# Patient Record
Sex: Female | Born: 1950 | Race: White | Hispanic: No | Marital: Married | Smoking: Never smoker
Health system: Southern US, Community
[De-identification: ages and names within clinical notes are randomized; demographics above are authoritative.]

## PROBLEM LIST (undated history)

## (undated) DIAGNOSIS — I340 Nonrheumatic mitral (valve) insufficiency: Secondary | ICD-10-CM

## (undated) DIAGNOSIS — S83206A Unspecified tear of unspecified meniscus, current injury, right knee, initial encounter: Secondary | ICD-10-CM

## (undated) DIAGNOSIS — F410 Panic disorder [episodic paroxysmal anxiety] without agoraphobia: Secondary | ICD-10-CM

## (undated) DIAGNOSIS — I071 Rheumatic tricuspid insufficiency: Secondary | ICD-10-CM

## (undated) DIAGNOSIS — J84112 Idiopathic pulmonary fibrosis: Secondary | ICD-10-CM

## (undated) DIAGNOSIS — Z8709 Personal history of other diseases of the respiratory system: Secondary | ICD-10-CM

## (undated) DIAGNOSIS — M2550 Pain in unspecified joint: Secondary | ICD-10-CM

## (undated) DIAGNOSIS — G4734 Idiopathic sleep related nonobstructive alveolar hypoventilation: Secondary | ICD-10-CM

## (undated) DIAGNOSIS — I5189 Other ill-defined heart diseases: Secondary | ICD-10-CM

## (undated) DIAGNOSIS — T753XXA Motion sickness, initial encounter: Secondary | ICD-10-CM

## (undated) DIAGNOSIS — I700 Atherosclerosis of aorta: Secondary | ICD-10-CM

## (undated) DIAGNOSIS — Z9981 Dependence on supplemental oxygen: Secondary | ICD-10-CM

## (undated) DIAGNOSIS — I351 Nonrheumatic aortic (valve) insufficiency: Secondary | ICD-10-CM

## (undated) DIAGNOSIS — J8410 Pulmonary fibrosis, unspecified: Secondary | ICD-10-CM

## (undated) DIAGNOSIS — R0600 Dyspnea, unspecified: Secondary | ICD-10-CM

## (undated) DIAGNOSIS — R053 Chronic cough: Secondary | ICD-10-CM

## (undated) DIAGNOSIS — K219 Gastro-esophageal reflux disease without esophagitis: Secondary | ICD-10-CM

## (undated) DIAGNOSIS — E213 Hyperparathyroidism, unspecified: Secondary | ICD-10-CM

## (undated) HISTORY — DX: Unspecified tear of unspecified meniscus, current injury, right knee, initial encounter: S83.206A

## (undated) HISTORY — DX: Gastro-esophageal reflux disease without esophagitis: K21.9

## (undated) HISTORY — DX: Panic disorder (episodic paroxysmal anxiety): F41.0

---

## 1976-07-16 HISTORY — PX: TUBAL LIGATION: SHX77

## 2004-07-16 HISTORY — PX: COLONOSCOPY: SHX174

## 2005-02-28 ENCOUNTER — Ambulatory Visit: Payer: Self-pay | Admitting: Unknown Physician Specialty

## 2005-04-02 ENCOUNTER — Ambulatory Visit: Payer: Self-pay | Admitting: Unknown Physician Specialty

## 2005-04-06 HISTORY — PX: ESOPHAGOGASTRODUODENOSCOPY: SHX1529

## 2006-02-26 ENCOUNTER — Ambulatory Visit: Payer: Self-pay | Admitting: Family Medicine

## 2007-01-10 ENCOUNTER — Emergency Department: Payer: Self-pay | Admitting: Emergency Medicine

## 2007-01-10 ENCOUNTER — Other Ambulatory Visit: Payer: Self-pay

## 2008-07-16 LAB — HM MAMMOGRAPHY

## 2008-07-27 ENCOUNTER — Ambulatory Visit: Payer: Self-pay | Admitting: Family Medicine

## 2009-06-23 ENCOUNTER — Ambulatory Visit: Payer: Self-pay

## 2009-07-16 HISTORY — PX: KNEE ARTHROSCOPY W/ MENISCAL REPAIR: SHX1877

## 2009-07-29 ENCOUNTER — Ambulatory Visit: Payer: Self-pay | Admitting: General Practice

## 2009-11-30 ENCOUNTER — Ambulatory Visit: Payer: Self-pay | Admitting: Internal Medicine

## 2011-09-04 ENCOUNTER — Encounter: Payer: Self-pay | Admitting: Internal Medicine

## 2011-09-04 ENCOUNTER — Ambulatory Visit (INDEPENDENT_AMBULATORY_CARE_PROVIDER_SITE_OTHER): Payer: Self-pay | Admitting: Internal Medicine

## 2011-09-04 DIAGNOSIS — E785 Hyperlipidemia, unspecified: Secondary | ICD-10-CM | POA: Insufficient documentation

## 2011-09-04 DIAGNOSIS — F411 Generalized anxiety disorder: Secondary | ICD-10-CM

## 2011-09-04 DIAGNOSIS — F419 Anxiety disorder, unspecified: Secondary | ICD-10-CM | POA: Insufficient documentation

## 2011-09-04 DIAGNOSIS — E039 Hypothyroidism, unspecified: Secondary | ICD-10-CM | POA: Insufficient documentation

## 2011-09-04 MED ORDER — LEVOTHYROXINE SODIUM 50 MCG PO TABS: 50.0000 ug | ORAL_TABLET | Freq: Every day | ORAL | Status: DC

## 2011-09-04 MED ORDER — ALPRAZOLAM 0.5 MG PO TABS: 0.5000 mg | ORAL_TABLET | Freq: Three times a day (TID) | ORAL | Status: DC | PRN

## 2011-09-04 NOTE — Assessment & Plan Note (Signed)
Symptoms well controlled with use of prn xanax. Will continue. Refill given today. Follow up 1 month.

## 2011-09-04 NOTE — Progress Notes (Signed)
Subjective:    Patient ID: Cheyenne Wong, female    DOB: June 30, 1951, 61 y.o.   MRN: [id number]  HPI 61 year old female with history of anxiety presents for followup. In regards to her anxiety, she notes that she recently tapered herself off Celexa because she was concerned about weight gain. She is currently using alprazolam as needed for severe episodes of anxiety. She reports significant improvement on this medication. She denies any drowsiness or other side effects.  She also notes that she recently had lab work performed by her employer. Labwork showed elevated TSH consistent with hypothyroidism. She acknowledges recent difficulty losing weight, issues of constipation, intolerance to temperature, and fatigue. She has never been diagnosed with hypothyroidism in the past. She does have a strong family history of this, however.  Also on her recent lab work, she was noted to have elevated lipids. LDL is elevated at 127. She has not taking cholesterol medication. She does have a strong family history of heart disease.  Outpatient Encounter Prescriptions as of 09/04/2011  Medication Sig Dispense Refill  . ALPRAZolam (XANAX) 0.5 MG tablet Take 1 tablet (0.5 mg total) by mouth 3 (three) times daily as needed.  90 tablet  3  . levothyroxine (SYNTHROID, LEVOTHROID) 50 MCG tablet Take 1 tablet (50 mcg total) by mouth daily.  30 tablet  3  . Multiple Vitamins-Minerals (MULTIVITAMIN WITH MINERALS) tablet Take 1 tablet by mouth daily.      Marland Kitchen DISCONTD: ALPRAZolam (XANAX) 0.5 MG tablet Take 0.5 mg by mouth 3 (three) times daily as needed.        Review of Systems  Constitutional: Positive for fatigue. Negative for fever, chills, appetite change and unexpected weight change.  HENT: Negative for ear pain, congestion, sore throat, trouble swallowing, neck pain, voice change and sinus pressure.   Eyes: Negative for visual disturbance.  Respiratory: Negative for cough, shortness of breath, wheezing and  stridor.   Cardiovascular: Negative for chest pain, palpitations and leg swelling.  Gastrointestinal: Positive for constipation. Negative for nausea, vomiting, abdominal pain, diarrhea, blood in stool, abdominal distention and anal bleeding.  Genitourinary: Negative for dysuria and flank pain.  Musculoskeletal: Negative for myalgias, arthralgias and gait problem.  Skin: Negative for color change and rash.  Neurological: Negative for dizziness and headaches.  Hematological: Negative for adenopathy. Does not bruise/bleed easily.  Psychiatric/Behavioral: Negative for suicidal ideas, sleep disturbance and dysphoric mood. The patient is nervous/anxious.    BP 132/82  Pulse 92  Temp(Src) 98 F (36.7 C) (Oral)  Ht 5' 3.5" (1.613 m)  Wt 154 lb (69.854 kg)  BMI 26.85 kg/m2  SpO2 100%     Objective:   Physical Exam  Constitutional: She is oriented to person, place, and time. She appears well-developed and well-nourished. No distress.  HENT:  Head: Normocephalic and atraumatic.  Right Ear: External ear normal.  Left Ear: External ear normal.  Nose: Nose normal.  Mouth/Throat: Oropharynx is clear and moist. No oropharyngeal exudate.  Eyes: Conjunctivae are normal. Pupils are equal, round, and reactive to light. Right eye exhibits no discharge. Left eye exhibits no discharge. No scleral icterus.  Neck: Normal range of motion. Neck supple. No tracheal deviation present. No thyromegaly present.  Cardiovascular: Normal rate, regular rhythm, normal heart sounds and intact distal pulses.  Exam reveals no gallop and no friction rub.   No murmur heard. Pulmonary/Chest: Effort normal and breath sounds normal. No respiratory distress. She has no wheezes. She has no rales. She exhibits no tenderness.  Musculoskeletal: Normal range of motion. She exhibits no edema and no tenderness.  Lymphadenopathy:    She has no cervical adenopathy.  Neurological: She is alert and oriented to person, place, and time.  No cranial nerve deficit. She exhibits normal muscle tone. Coordination normal.  Skin: Skin is warm and dry. No rash noted. She is not diaphoretic. No erythema. No pallor.  Psychiatric: She has a normal mood and affect. Her behavior is normal. Judgment and thought content normal.          Assessment & Plan:

## 2011-09-04 NOTE — Assessment & Plan Note (Signed)
LDL slightly elevated at 127.  Goal LDL<100. Suspect that cholesterol may improved with treatment of hypothyroidism. Will treat thyroid as above and plan to repeat cholesterol in 3-6 months.

## 2011-09-04 NOTE — Assessment & Plan Note (Signed)
New diagnosis. TSH 4.57 and symptomatic with fatigue, weight gain, constipation.  Will start Synthroid daily.  Repeat TSH in 1 month.  Follow up 1 month.

## 2011-09-14 ENCOUNTER — Encounter: Payer: Self-pay | Admitting: *Deleted

## 2011-09-21 ENCOUNTER — Encounter: Payer: Self-pay | Admitting: Internal Medicine

## 2011-09-28 ENCOUNTER — Other Ambulatory Visit (INDEPENDENT_AMBULATORY_CARE_PROVIDER_SITE_OTHER): Payer: Self-pay | Admitting: *Deleted

## 2011-09-28 DIAGNOSIS — E039 Hypothyroidism, unspecified: Secondary | ICD-10-CM

## 2011-09-28 NOTE — Progress Notes (Signed)
Addended by: Jobie Quaker on: 09/28/2011 03:47 PM   Modules accepted: Orders

## 2011-10-02 ENCOUNTER — Encounter: Payer: Self-pay | Admitting: Internal Medicine

## 2011-10-02 ENCOUNTER — Ambulatory Visit (INDEPENDENT_AMBULATORY_CARE_PROVIDER_SITE_OTHER): Payer: Self-pay | Admitting: Internal Medicine

## 2011-10-02 VITALS — BP 102/62 | HR 84 | Temp 97.3°F | Wt 156.0 lb

## 2011-10-02 DIAGNOSIS — F419 Anxiety disorder, unspecified: Secondary | ICD-10-CM

## 2011-10-02 DIAGNOSIS — F411 Generalized anxiety disorder: Secondary | ICD-10-CM

## 2011-10-02 DIAGNOSIS — E785 Hyperlipidemia, unspecified: Secondary | ICD-10-CM

## 2011-10-02 DIAGNOSIS — E559 Vitamin D deficiency, unspecified: Secondary | ICD-10-CM

## 2011-10-02 DIAGNOSIS — E039 Hypothyroidism, unspecified: Secondary | ICD-10-CM

## 2011-10-02 MED ORDER — LEVOTHYROXINE SODIUM 50 MCG PO TABS: 50.0000 ug | ORAL_TABLET | Freq: Every day | ORAL | Status: DC

## 2011-10-02 NOTE — Assessment & Plan Note (Signed)
Symptoms well controlled with prn xanax. Will continue. 

## 2011-10-02 NOTE — Progress Notes (Signed)
Subjective:    Patient ID: Cheyenne Wong, female    DOB: 1950-07-21, 61 y.o.   MRN: [id number]  HPI 60 year old female with history of hypothyroidism presents for followup. She reports significant improvement in her fatigue since starting Synthroid. Her recent thyroid function testing showed TSH of 0.8. This is an improvement compared to previous level of 4.5. She notes that she is generally feeling much better, she has much more energy and is able to work throughout the day without exhaustion. She has no new complaints or concerns today.  Outpatient Encounter Prescriptions as of 10/02/2011  Medication Sig Dispense Refill  . ALPRAZolam (XANAX) 0.5 MG tablet Take 1 tablet (0.5 mg total) by mouth 3 (three) times daily as needed.  90 tablet  3  . levothyroxine (SYNTHROID, LEVOTHROID) 50 MCG tablet Take 1 tablet (50 mcg total) by mouth daily.  30 tablet  11  . Multiple Vitamins-Minerals (MULTIVITAMIN WITH MINERALS) tablet Take 1 tablet by mouth daily.      Marland Kitchen DISCONTD: levothyroxine (SYNTHROID, LEVOTHROID) 50 MCG tablet Take 1 tablet (50 mcg total) by mouth daily.  30 tablet  3    Review of Systems  Constitutional: Negative for fever, chills, appetite change, fatigue and unexpected weight change.  HENT: Negative for ear pain, congestion, sore throat, trouble swallowing, neck pain, voice change and sinus pressure.   Eyes: Negative for visual disturbance.  Respiratory: Negative for cough, shortness of breath, wheezing and stridor.   Cardiovascular: Negative for chest pain, palpitations and leg swelling.  Gastrointestinal: Negative for nausea, vomiting, abdominal pain, diarrhea, constipation, blood in stool, abdominal distention and anal bleeding.  Genitourinary: Negative for dysuria and flank pain.  Musculoskeletal: Negative for myalgias, arthralgias and gait problem.  Skin: Negative for color change and rash.  Neurological: Negative for dizziness and headaches.  Hematological: Negative for  adenopathy. Does not bruise/bleed easily.  Psychiatric/Behavioral: Negative for suicidal ideas, sleep disturbance and dysphoric mood. The patient is not nervous/anxious.    BP 102/62  Pulse 84  Temp(Src) 97.3 F (36.3 C) (Oral)  Wt 156 lb (70.761 kg)  SpO2 100%     Objective:   Physical Exam  Constitutional: She is oriented to person, place, and time. She appears well-developed and well-nourished. No distress.  HENT:  Head: Normocephalic and atraumatic.  Right Ear: External ear normal.  Left Ear: External ear normal.  Nose: Nose normal.  Mouth/Throat: Oropharynx is clear and moist. No oropharyngeal exudate.  Eyes: Conjunctivae are normal. Pupils are equal, round, and reactive to light. Right eye exhibits no discharge. Left eye exhibits no discharge. No scleral icterus.  Neck: Normal range of motion. Neck supple. No tracheal deviation present. No thyromegaly present.  Cardiovascular: Normal rate, regular rhythm, normal heart sounds and intact distal pulses.  Exam reveals no gallop and no friction rub.   No murmur heard. Pulmonary/Chest: Effort normal and breath sounds normal. No respiratory distress. She has no wheezes. She has no rales. She exhibits no tenderness.  Musculoskeletal: Normal range of motion. She exhibits no edema and no tenderness.  Lymphadenopathy:    She has no cervical adenopathy.  Neurological: She is alert and oriented to person, place, and time. No cranial nerve deficit. She exhibits normal muscle tone. Coordination normal.  Skin: Skin is warm and dry. No rash noted. She is not diaphoretic. No erythema. No pallor.  Psychiatric: She has a normal mood and affect. Her behavior is normal. Judgment and thought content normal.  Assessment & Plan:

## 2012-03-06 ENCOUNTER — Telehealth: Payer: Self-pay | Admitting: Internal Medicine
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
## 2012-03-06 NOTE — Telephone Encounter (Signed)
Called and left message on machine at home advising patient as instructed.  Advised her to call back and let us know that she did or didn't go to the ED.

## 2012-03-06 NOTE — Telephone Encounter (Signed)
Pt must go to ED for evaluation. Agree with CAN, may have MI,PE.

## 2012-03-07 NOTE — Telephone Encounter (Signed)
Left message on machine at home for patient to return call. 

## 2012-03-10 NOTE — Telephone Encounter (Signed)
Left message on machine at home for patient to return call.  Unable to reach letter mailed to home address.

## 2012-04-02 ENCOUNTER — Other Ambulatory Visit: Payer: Self-pay

## 2012-04-07 ENCOUNTER — Encounter: Payer: Self-pay | Admitting: Internal Medicine

## 2012-05-13 ENCOUNTER — Telehealth: Payer: Self-pay | Admitting: Internal Medicine

## 2012-05-13 NOTE — Telephone Encounter (Signed)
Caller: [name]/Patient; Patient Name: Cheyenne Wong; PCP: Ronna Polio (Adults only); Best Callback Phone Number: [phone]; Reason for call: Other   05-13-12 she said for past week or so she has been having shortness of breath, feels like she can't get a deep breath, a squeezing feeling in chest and a 12 lb wght gain in a month. She said her abdomen feels big.  she said feet and legs painful but hasn't noticed any swellling.  Per Breathing Problems protocols emergent sx of new or worsening breathing problems that have not been evaluated  Advised her to go to the ED for evaluation. Sent to ED not appropriate for office visit

## 2012-05-16 ENCOUNTER — Ambulatory Visit: Payer: Self-pay | Admitting: Pulmonary Disease

## 2012-05-19 ENCOUNTER — Encounter: Payer: Self-pay | Admitting: Pulmonary Disease

## 2012-05-19 ENCOUNTER — Ambulatory Visit (INDEPENDENT_AMBULATORY_CARE_PROVIDER_SITE_OTHER): Payer: Self-pay | Admitting: Pulmonary Disease

## 2012-05-19 VITALS — BP 122/70 | HR 70 | Temp 98.0°F | Ht 63.0 in | Wt 159.1 lb

## 2012-05-19 DIAGNOSIS — F419 Anxiety disorder, unspecified: Secondary | ICD-10-CM

## 2012-05-19 DIAGNOSIS — R0600 Dyspnea, unspecified: Secondary | ICD-10-CM

## 2012-05-19 DIAGNOSIS — F411 Generalized anxiety disorder: Secondary | ICD-10-CM

## 2012-05-19 DIAGNOSIS — R0602 Shortness of breath: Secondary | ICD-10-CM

## 2012-05-19 DIAGNOSIS — R0789 Other chest pain: Secondary | ICD-10-CM

## 2012-05-19 DIAGNOSIS — Z23 Encounter for immunization: Secondary | ICD-10-CM

## 2012-05-19 DIAGNOSIS — R0609 Other forms of dyspnea: Secondary | ICD-10-CM

## 2012-05-19 NOTE — Patient Instructions (Addendum)
We will have you get a Chest X-ray for shortness of breath We will have you get a metacholine challenge test at [facility] to look for asthma.  Do not use your albuterol inhaler for 24 hours before this test. Follow the GERD diet instructions we provided you. Take the Zantac every night We will have you get a stress test with Dr. Windell Hummingbird office. We will see you back in 2-4 weeks after all these tests have been completed. Until then, use the albuterol inhaler 2 puffs every four hours as needed for shortness of breath.

## 2012-05-19 NOTE — Assessment & Plan Note (Signed)
Mrs. Scarfone and I had lengthy conversation over the various causes of shortness of breath today in clinic. She tells me without prompting that she thinks that her shortness of breath is related to stress and anxiety. I explained to her that this is really a diagnosis of exclusion and that it would be reasonable for [place] to look for some more common causes of shortness of breath for someone her age group. If these tests come back negative I think we will focus on vocal cord dysfunction which could certainly be related to her anxiety.  At this point I am encouraged by the fact that her lung exam, oximetry with exertion, and spirometry showed no evidence of lung disease.  Plan: -Methacholine challenge test will be ordered at [facility] -Order a chest x-ray -Order a exercise stress test with nuclear perfusion imaging -If these tests are negative then I will refer her to the [facility] with Dr. Lenard Simmer for evaluation and management of vocal cord dysfunction

## 2012-05-19 NOTE — Progress Notes (Signed)
Subjective:    Patient ID: Cheyenne Wong, female    DOB: [birth year], 61 y.o.   MRN: [id number]  HPI  This is a very pleasant 61 year old female with a past medical history significant for hypothyroidism and anxiety who comes to our clinic today for evaluation of shortness of breath. She tells me that as a child she had bronchitis on a fairly regular basis but has never been diagnosed with asthma. Throughout most of her adult life she says that she has experienced bronchitis on at least an annual basis. She has never smoked cigarettes and it sounds like her to secondhand exposure has been minimal. She states that for the past several years she has had shortness of breath that is associated with cough and wheeze. She tells me that she "never feels comfortable breathing" and is always short of breath even while at rest. Exertion sometimes makes this sensation worse but then often she can feel more short of breath while just at rest. There are certain environmental triggers such as cigarette smoke, perfumes, and pollen which will make her feel even more short of breath. When she is exposed to these things she feels that she cannot get enough air in. She also notes that she feels more short of breath while lying flat and often feels that she has him "marble in her throat". She has had acid reflux in the past but feels that it is currently under control with diet and occasional Zantac use. She has occasional chest tightness which occurs in the left side of her chest either at rest or exertion and lasts "all day". She notes itchy eyes nearly every day but rare sinus congestion. She says that sometimes in the morning she has to blow her nose but is not a chronic problem throughout the day.  In terms of the episodes of bronchitis she says she has never been hospitalized and typically she gets antibiotics but she does not remember which one. She says that she doesn't regain prednisone many many years ago. In 1998  she had pneumonia but she has not had it since. She was prescribed an inhaler 10 years ago but says that this did not help.  The chest pain she describes as tightness or squeezing that occurs in her chest either at rest or on exertion. She apparently has been to the emergency room "many times" thinking that she was having a heart attack and she has had multiple negative EKGs. She tells me that she has never had a stress test as doctors in the emergency room have always attributed her symptoms to anxiety.  She states that she gets short of breath with exertion on a fairly regular basis and can only walk about a block before she gets short of breath.  She tells me that she feels that her shortness of breath is due in large part if not entirely to her anxiety. She says that she has to care for her husband who is paralyzed at home and this gives her an enormous amount of stress. She was taking an SSRI one year ago and her symptoms were better controlled then, however she did not like the way the medication made her feel so she stopped it. She continues to use Xanax as needed to control her anxiety.   Past Medical History  Diagnosis Date  . Panic disorder   . Vitamin D deficiency   . GERD (gastroesophageal reflux disease)   . Right knee meniscal tear  Family History  Problem Relation Age of Onset  . Hyperlipidemia Mother   . Heart disease Mother   . Stroke Mother   . Hypertension Mother   . Depression Mother   . ALS Father   . Cancer Maternal Grandfather 94    Lung Ca  . Heart attack Other   . Early death Other     Due to Heart attacks, multiple cousins & uncles  . Heart disease Other      History   Social History  . Marital Status: Married    Spouse Name: N/A    Number of Children: 2  . Years of Education: N/A   Occupational History  . Nursing Assistant - [employer]     Social History Main Topics  . Smoking status: Never Smoker   . Smokeless tobacco:  Never Used  . Alcohol Use: No  . Drug Use: No  . Sexually Active: Not on file   Other Topics Concern  . Not on file   Social History Narrative   Regular Exercise -  Walk 3 days a week x 30 minDaily Caffeine Use:  1 coffee, 1 soda     Allergies  Allergen Reactions  . Penicillins     Allergy as a child     Outpatient Prescriptions Prior to Visit  Medication Sig Dispense Refill  . ALPRAZolam (XANAX) 0.5 MG tablet Take 1 tablet (0.5 mg total) by mouth 3 (three) times daily as needed.  90 tablet  3  . levothyroxine (SYNTHROID, LEVOTHROID) 50 MCG tablet Take 1 tablet (50 mcg total) by mouth daily.  30 tablet  11  . Multiple Vitamins-Minerals (MULTIVITAMIN WITH MINERALS) tablet Take 1 tablet by mouth daily.       Last reviewed on 05/19/2012  9:56 AM by Christen Butter, CMA    Review of Systems  Constitutional: Negative for fever, chills and unexpected weight change.  HENT: Positive for trouble swallowing. Negative for ear pain, nosebleeds, congestion, sore throat, rhinorrhea, sneezing, dental problem, voice change, postnasal drip and sinus pressure.   Eyes: Negative for visual disturbance.  Respiratory: Positive for cough and shortness of breath. Negative for choking.   Cardiovascular: Positive for chest pain. Negative for leg swelling.  Gastrointestinal: Negative for vomiting, abdominal pain and diarrhea.  Genitourinary: Negative for difficulty urinating.  Musculoskeletal: Negative for arthralgias.  Skin: Negative for rash.  Neurological: Negative for tremors, syncope and headaches.  Hematological: Does not bruise/bleed easily.       Objective:   Physical Exam  Filed Vitals:   05/19/12 0958  BP: 122/70  Pulse: 70  Temp: 98 F (36.7 C)  TempSrc: Oral  Height: 5\' 3"  (1.6 m)  Weight: 159 lb 1.9 oz (72.176 kg)  SpO2: 100%   Gen: well appearing, no acute distress HEENT: NCAT, PERRL, EOMi, OP clear, neck supple without masses PULM: CTA B CV: RRR, no mgr, no JVD AB:  BS+, soft, nontender, no hsm Ext: warm, no edema, no clubbing, no cyanosis Derm: no rash or skin breakdown Neuro: A&Ox4, CN II-XII intact, strength 5/5 in all 4 extremities  05/19/2012 Simple spirometry normal      Assessment & Plan:   Shortness of breath Mrs. Face and I had lengthy conversation over the various causes of shortness of breath today in clinic. She tells me without prompting that she thinks that her shortness of breath is related to stress and anxiety. I explained to her that this is really a diagnosis of exclusion  and that it would be reasonable for [place] to look for some more common causes of shortness of breath for someone her age group. If these tests come back negative I think we will focus on vocal cord dysfunction which could certainly be related to her anxiety.  At this point I am encouraged by the fact that her lung exam, oximetry with exertion, and spirometry showed no evidence of lung disease.  Plan: -Methacholine challenge test will be ordered at [facility] -Order a chest x-ray -Order a exercise stress test with nuclear perfusion imaging -If these tests are negative then I will refer her to the [facility] with Dr. Lenard Simmer for evaluation and management of vocal cord dysfunction  Anxiety As noted above she and I agree that her anxiety is contributing to her shortness of breath. She did not like the way she felt when she was on an SSRI last year however it seems that her anxiety and stress was better controlled on this. If our workup comes up negative for any evidence of coronary disease or asthma (which I expect) then she will likely need something other than the benzodiazepine in addition to management of vocal cord dysfunction.   Updated Medication List Outpatient Encounter Prescriptions as of 05/19/2012  Medication Sig Dispense Refill  . ALPRAZolam (XANAX) 0.5 MG tablet Take 1 tablet (0.5 mg total) by mouth 3 (three) times daily as needed.  90 tablet  3  .  levothyroxine (SYNTHROID, LEVOTHROID) 50 MCG tablet Take 1 tablet (50 mcg total) by mouth daily.  30 tablet  11  . Multiple Vitamins-Minerals (MULTIVITAMIN WITH MINERALS) tablet Take 1 tablet by mouth daily.

## 2012-05-20 NOTE — Assessment & Plan Note (Signed)
As noted above she and I agree that her anxiety is contributing to her shortness of breath. She did not like the way she felt when she was on an SSRI last year however it seems that her anxiety and stress was better controlled on this. If our workup comes up negative for any evidence of coronary disease or asthma (which I expect) then she will likely need something other than the benzodiazepine in addition to management of vocal cord dysfunction.

## 2012-05-27 ENCOUNTER — Ambulatory Visit: Payer: Self-pay | Admitting: Pulmonary Disease

## 2012-05-27 LAB — PULMONARY FUNCTION TEST

## 2012-05-29 ENCOUNTER — Telehealth: Payer: Self-pay | Admitting: Pulmonary Disease

## 2012-05-29 NOTE — Telephone Encounter (Signed)
Pt requested that Myocardial Perfusion Study that was scheduled for 05/30/12 at [facility] be rescheduled until after the first of the year due to insurance reasons. Pt has been rescheduled for Tues 07/22/12 at 10:30 at [facility]. Pt will need to arrive at 10:15 and NPO after midnight. Called and spoke with patient and gave her the new appointment date, time and instructions. Advised patient to call me when she received her new insurance card so that I can check to see if this requires pre cert. Pt has been advised of the above and voiced understanding. Rhonda J Cobb

## 2012-05-29 NOTE — Telephone Encounter (Signed)
Will forward this to [facility] per her request

## 2012-06-03 ENCOUNTER — Encounter: Payer: Self-pay | Admitting: Pulmonary Disease

## 2012-06-03 ENCOUNTER — Telehealth: Payer: Self-pay | Admitting: *Deleted

## 2012-06-03 NOTE — Telephone Encounter (Signed)
Spoke with pt and notified MCT was neg She verbalized understanding Pt states plans to have the cxr done in Jan 2014 when she goes back to have her stress test done Will forward to Dr. Kendrick Fries to make him aware

## 2012-06-03 NOTE — Telephone Encounter (Signed)
Message copied by Christen Butter on Tue Jun 03, 2012 10:16 AM ------      Message from: Max Fickle B      Created: Tue Jun 03, 2012 10:05 AM       L,            Please let her know that her metacholine challenge test was normal            B

## 2012-06-03 NOTE — Telephone Encounter (Signed)
LMTCB- need to inform of MCT results and also to ask her why she did not have her CXR done at [facility]-? Does she still have the order?

## 2012-06-03 NOTE — Telephone Encounter (Signed)
Patient returning call.

## 2012-07-04 ENCOUNTER — Encounter: Payer: Self-pay | Admitting: Pulmonary Disease

## 2012-07-15 ENCOUNTER — Telehealth: Payer: Self-pay | Admitting: Pulmonary Disease

## 2012-07-15 NOTE — Telephone Encounter (Signed)
Will forward to Dr. McQuaid so he is aware 

## 2012-07-18 NOTE — Telephone Encounter (Signed)
Will sign and forward as FYI per triage protocol.

## 2012-08-26 ENCOUNTER — Encounter: Payer: Self-pay | Admitting: Internal Medicine

## 2012-08-26 ENCOUNTER — Ambulatory Visit (INDEPENDENT_AMBULATORY_CARE_PROVIDER_SITE_OTHER): Payer: BC Managed Care – PPO | Admitting: Internal Medicine

## 2012-08-26 VITALS — BP 130/86 | HR 58 | Temp 97.7°F | Ht 63.5 in | Wt 151.2 lb

## 2012-08-26 DIAGNOSIS — R079 Chest pain, unspecified: Secondary | ICD-10-CM

## 2012-08-26 DIAGNOSIS — E039 Hypothyroidism, unspecified: Secondary | ICD-10-CM

## 2012-08-26 DIAGNOSIS — F419 Anxiety disorder, unspecified: Secondary | ICD-10-CM

## 2012-08-26 DIAGNOSIS — F411 Generalized anxiety disorder: Secondary | ICD-10-CM | POA: Insufficient documentation

## 2012-08-26 LAB — COMPREHENSIVE METABOLIC PANEL
ALT: 31 U/L (ref 0–35)
AST: 34 U/L (ref 0–37)
CO2: 28 mEq/L (ref 19–32)
Chloride: 103 mEq/L (ref 96–112)
Creatinine, Ser: 0.7 mg/dL (ref 0.4–1.2)
GFR: 97.29 mL/min (ref >60.00)
Sodium: 138 mEq/L (ref 135–145)
Total Bilirubin: 0.5 mg/dL (ref 0.3–1.2)
Total Protein: 6.8 g/dL (ref 6.0–8.3)

## 2012-08-26 LAB — LDL CHOLESTEROL, DIRECT: Direct LDL: 107.2 mg/dL

## 2012-08-26 LAB — LIPID PANEL
HDL: 84.4 mg/dL (ref >39.00)
VLDL: 27.2 mg/dL (ref 0.0–40.0)

## 2012-08-26 MED ORDER — ALPRAZOLAM 0.5 MG PO TABS: 0.5000 mg | ORAL_TABLET | Freq: Three times a day (TID) | ORAL | Status: DC | PRN

## 2012-08-26 MED ORDER — LEVOTHYROXINE SODIUM 50 MCG PO TABS: 50.0000 ug | ORAL_TABLET | Freq: Every day | ORAL | Status: DC

## 2012-08-26 NOTE — Assessment & Plan Note (Addendum)
Left sided chest tightness, radiating to left arm concerning for coronary artery ischemia. Risk factors include HL and family history. EKG normal today. Will set up cardiology evaluation with stress test. Will check troponin, TSH, lipid profile with labs today.
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
## 2012-08-26 NOTE — Assessment & Plan Note (Signed)
Pt has not been taking levothyroxine. Symptomatic with fatigue, increased symptoms of anxiety and depression. Will check TSH and Free T4 with labs today. Follow up 1 month.

## 2012-08-27 LAB — TROPONIN I: Troponin I: <0.01 ng/mL (ref <0.06)

## 2012-09-02 NOTE — Progress Notes (Signed)
No answer or voicemail to leave a message. 

## 2012-09-08 ENCOUNTER — Ambulatory Visit: Payer: BC Managed Care – PPO | Admitting: Cardiovascular Disease

## 2012-09-08 ENCOUNTER — Encounter: Payer: Self-pay | Admitting: *Deleted

## 2012-09-25 ENCOUNTER — Encounter: Payer: Self-pay | Admitting: Internal Medicine

## 2012-09-25 ENCOUNTER — Ambulatory Visit (INDEPENDENT_AMBULATORY_CARE_PROVIDER_SITE_OTHER): Payer: BC Managed Care – PPO | Admitting: Internal Medicine

## 2012-09-25 VITALS — BP 140/92 | HR 60 | Temp 97.9°F | Wt 158.0 lb

## 2012-09-25 DIAGNOSIS — E039 Hypothyroidism, unspecified: Secondary | ICD-10-CM

## 2012-09-25 DIAGNOSIS — Z1239 Encounter for other screening for malignant neoplasm of breast: Secondary | ICD-10-CM | POA: Insufficient documentation

## 2012-09-25 DIAGNOSIS — R079 Chest pain, unspecified: Secondary | ICD-10-CM

## 2012-09-25 LAB — HM MAMMOGRAPHY: HM Mammogram: NORMAL

## 2012-09-25 LAB — TSH: TSH: 0.84 u[IU]/mL (ref 0.35–5.50)

## 2012-09-25 LAB — T4, FREE: Free T4: 1.13 ng/dL (ref 0.60–1.60)

## 2012-09-25 NOTE — Assessment & Plan Note (Signed)
Symptomatically doing well. Will recheck TSH and free T4 with labs today. Continue levothyroxine.

## 2012-09-25 NOTE — Assessment & Plan Note (Signed)
Symptoms of chest pain have resolved. Pt cancelled cardiology follow up because of financial issues. Will continue to monitor symptoms for now.

## 2012-09-25 NOTE — Assessment & Plan Note (Signed)
Patient physical exam or mammogram because of finances. Encouraged her to schedule mammogram through charity program to a local hospital, [facility].

## 2012-09-25 NOTE — Progress Notes (Signed)
Subjective:    Patient ID: Cheyenne Wong, female    DOB: 08/01/1950, 62 y.o.   MRN: [id number]  HPI 62 year old female with history of hypothyroidism and anxiety presents for followup. She reports she has been feeling well. Symptoms of chest pain she noted on previous visit have resolved. She canceled cardiology appointment and stress testing. She denies any side effects from the thyroid medication. She denies any new concerns today. She does not wish to schedule routine maintenance exam or mammogram because of out-of-pocket cost and limited finances.  Outpatient Encounter Prescriptions as of 09/25/2012  Medication Sig Dispense Refill  . ALPRAZolam (XANAX) 0.5 MG tablet Take 1 tablet (0.5 mg total) by mouth 3 (three) times daily as needed for anxiety.  270 tablet  1  . levothyroxine (SYNTHROID, LEVOTHROID) 50 MCG tablet Take 1 tablet (50 mcg total) by mouth daily.  90 tablet  4  . Probiotic Product (PROBIOTIC DAILY PO) Take by mouth daily.      . ranitidine (ZANTAC) 150 MG tablet Take 150 mg by mouth daily as needed for heartburn.      . Multiple Vitamins-Minerals (MULTIVITAMIN WITH MINERALS) tablet Take 1 tablet by mouth daily.       No facility-administered encounter medications on file as of 09/25/2012.   BP 140/92  Pulse 60  Temp(Src) 97.9 F (36.6 C) (Oral)  Wt 158 lb (71.668 kg)  BMI 27.55 kg/m2  SpO2 98%  Review of Systems  Constitutional: Negative for fever, chills, appetite change, fatigue and unexpected weight change.  HENT: Negative for ear pain, congestion, sore throat, trouble swallowing, neck pain, voice change and sinus pressure.   Eyes: Negative for visual disturbance.  Respiratory: Negative for cough, shortness of breath, wheezing and stridor.   Cardiovascular: Negative for chest pain, palpitations and leg swelling.  Gastrointestinal: Negative for nausea, vomiting, abdominal pain, diarrhea, constipation, blood in stool, abdominal distention and anal bleeding.   Genitourinary: Negative for dysuria and flank pain.  Musculoskeletal: Negative for myalgias, arthralgias and gait problem.  Skin: Negative for color change and rash.  Neurological: Negative for dizziness and headaches.  Hematological: Negative for adenopathy. Does not bruise/bleed easily.  Psychiatric/Behavioral: Negative for suicidal ideas, sleep disturbance and dysphoric mood. The patient is not nervous/anxious.        Objective:   Physical Exam  Constitutional: She is oriented to person, place, and time. She appears well-developed and well-nourished. No distress.  HENT:  Head: Normocephalic and atraumatic.  Right Ear: External ear normal.  Left Ear: External ear normal.  Nose: Nose normal.  Mouth/Throat: Oropharynx is clear and moist. No oropharyngeal exudate.  Eyes: Conjunctivae are normal. Pupils are equal, round, and reactive to light. Right eye exhibits no discharge. Left eye exhibits no discharge. No scleral icterus.  Neck: Normal range of motion. Neck supple. No tracheal deviation present. No thyromegaly present.  Cardiovascular: Normal rate, regular rhythm, normal heart sounds and intact distal pulses.  Exam reveals no gallop and no friction rub.   No murmur heard. Pulmonary/Chest: Effort normal and breath sounds normal. No respiratory distress. She has no wheezes. She has no rales. She exhibits no tenderness.  Musculoskeletal: Normal range of motion. She exhibits no edema and no tenderness.  Lymphadenopathy:    She has no cervical adenopathy.  Neurological: She is alert and oriented to person, place, and time. No cranial nerve deficit. She exhibits normal muscle tone. Coordination normal.  Skin: Skin is warm and dry. No rash noted. She is not diaphoretic. No erythema.  No pallor.  Psychiatric: She has a normal mood and affect. Her behavior is normal. Judgment and thought content normal.          Assessment & Plan:

## 2012-12-18 ENCOUNTER — Other Ambulatory Visit: Payer: Self-pay

## 2012-12-18 DIAGNOSIS — E039 Hypothyroidism, unspecified: Secondary | ICD-10-CM

## 2012-12-18 MED ORDER — LEVOTHYROXINE SODIUM 50 MCG PO TABS: 50.0000 ug | ORAL_TABLET | Freq: Every day | ORAL | Status: DC

## 2012-12-24 ENCOUNTER — Telehealth: Payer: Self-pay | Admitting: Internal Medicine

## 2012-12-24 NOTE — Telephone Encounter (Signed)
Left message for pharmacy to switch and patient is aware

## 2012-12-24 NOTE — Telephone Encounter (Signed)
Fine to switch brands

## 2012-12-24 NOTE — Telephone Encounter (Signed)
Patient left a message stating Cheyenne Wong will not refill her thyroid medication until we give them approval to switch the brands

## 2013-03-30 ENCOUNTER — Encounter: Payer: Self-pay | Admitting: Cardiovascular Disease

## 2013-03-30 ENCOUNTER — Ambulatory Visit (INDEPENDENT_AMBULATORY_CARE_PROVIDER_SITE_OTHER): Payer: BC Managed Care – PPO | Admitting: Cardiovascular Disease

## 2013-03-30 VITALS — BP 120/86 | HR 66 | Ht 63.0 in | Wt 157.2 lb

## 2013-03-30 DIAGNOSIS — R0602 Shortness of breath: Secondary | ICD-10-CM

## 2013-03-30 DIAGNOSIS — E785 Hyperlipidemia, unspecified: Secondary | ICD-10-CM

## 2013-03-30 DIAGNOSIS — R079 Chest pain, unspecified: Secondary | ICD-10-CM

## 2013-03-30 NOTE — Assessment & Plan Note (Signed)
Atypical type squeezing in her chest at rest. We will order a routine treadmill study.

## 2013-03-30 NOTE — Progress Notes (Signed)
Patient ID: Cheyenne Wong, female    DOB: Sep 07, 1950, 62 y.o.   MRN: [id number]  HPI Comments: Cheyenne Wong is a very pleasant 62 year old woman with a history of anxiety, stress, reflux disease, strong family history of CAD and MI who presents by referral from Dr. Dan Humphreys for symptoms of chest pain, shortness of breath.  She states that for the past several months, she has had a squeezing in her chest sometimes presenting with left tingling arm pain and a cough. Symptoms at rest, sometimes at nighttime when she is sleeping.  She does not exercise. She is limited in her ability to exert herself secondary to shortness of breath. She try to exercise and walk earlier this year but was unable to continue as she was too tired and had shortness of breath.  She has significant symptoms at nighttime when laying down. She has shortness of breath is better on her side. She is concerned about obstruction around her vocal cord area.   She takes care of her husband who is a paraplegic. She has broken sleep every night and has to wake frequently to help him. She reports having abdominal upset with aspirin  EKG shows normal sinus rhythm with rate 66 beats per minute, no significant ST or T wave changes      Outpatient Encounter Prescriptions as of 03/30/2013  Medication Sig Dispense Refill  . ALPRAZolam (XANAX) 0.5 MG tablet Take 1 tablet (0.5 mg total) by mouth 3 (three) times daily as needed for anxiety.  270 tablet  1  . levothyroxine (SYNTHROID, LEVOTHROID) 50 MCG tablet Take 1 tablet (50 mcg total) by mouth daily.  90 tablet  2  . ranitidine (ZANTAC) 150 MG tablet Take 150 mg by mouth daily as needed for heartburn.        Review of Systems  Constitutional: Negative.   HENT: Negative.   Eyes: Negative.   Respiratory: Positive for shortness of breath.   Cardiovascular: Positive for chest pain.  Gastrointestinal: Negative.   Endocrine: Negative.   Musculoskeletal: Negative.   Skin: Negative.    Allergic/Immunologic: Negative.   Neurological: Negative.   Hematological: Negative.   Psychiatric/Behavioral: Negative.   All other systems reviewed and are negative.    BP 120/86  Pulse 66  Ht 5\' 3"  (1.6 m)  Wt 157 lb 4 oz (71.328 kg)  BMI 27.86 kg/m2  Physical Exam  Nursing note and vitals reviewed. Constitutional: She is oriented to person, place, and time. She appears well-developed and well-nourished.  HENT:  Head: Normocephalic.  Nose: Nose normal.  Mouth/Throat: Oropharynx is clear and moist.  Eyes: Conjunctivae are normal. Pupils are equal, round, and reactive to light.  Neck: Normal range of motion. Neck supple. No JVD present.  Cardiovascular: Normal rate, regular rhythm, S1 normal, S2 normal, normal heart sounds and intact distal pulses.  Exam reveals no gallop and no friction rub.   No murmur heard. Pulmonary/Chest: Effort normal and breath sounds normal. No respiratory distress. She has no wheezes. She has no rales. She exhibits no tenderness.  Abdominal: Soft. Bowel sounds are normal. She exhibits no distension. There is no tenderness.  Musculoskeletal: Normal range of motion. She exhibits no edema and no tenderness.  Lymphadenopathy:    She has no cervical adenopathy.  Neurological: She is alert and oriented to person, place, and time. Coordination normal.  Skin: Skin is warm and dry. No rash noted. No erythema.  Psychiatric: She has a normal mood and affect. Her behavior is normal.  Judgment and thought content normal.    Assessment and Plan

## 2013-03-30 NOTE — Assessment & Plan Note (Signed)
She will try red yeast rice. She does have a very strong family history. Long discussion about improving her cholesterol level. Encouraged diet and very gentle weight loss. She may benefit from low dose statin

## 2013-03-30 NOTE — Patient Instructions (Addendum)
You are doing well. No medication changes were made.  Try Red Yeast Rice 1 to 4 a day for cholesterol   We will order a routine treadmill test for chest pain, shortness of breath  Please call us if you have new issues that need to be addressed before your next appt.

## 2013-03-30 NOTE — Assessment & Plan Note (Signed)
Etiology of her shortness of breath is unclear. Clinical exam is essentially benign. EKG is normal. We have ordered a routine treadmill study to rule out ischemia. If treadmill is normal, uncertain if there would be much benefit to an echocardiogram as there are no murmurs, no clinical signs of heart failure. This could certainly be done if symptoms get worse.

## 2013-03-31 ENCOUNTER — Ambulatory Visit: Payer: BC Managed Care – PPO | Admitting: Emergency Medicine

## 2013-03-31 ENCOUNTER — Encounter: Payer: Self-pay | Admitting: Internal Medicine

## 2013-03-31 ENCOUNTER — Ambulatory Visit (INDEPENDENT_AMBULATORY_CARE_PROVIDER_SITE_OTHER): Payer: BC Managed Care – PPO | Admitting: Internal Medicine

## 2013-03-31 VITALS — BP 120/80 | HR 73 | Temp 97.8°F | Ht 63.0 in | Wt 159.6 lb

## 2013-03-31 DIAGNOSIS — R0602 Shortness of breath: Secondary | ICD-10-CM

## 2013-03-31 DIAGNOSIS — F411 Generalized anxiety disorder: Secondary | ICD-10-CM

## 2013-03-31 DIAGNOSIS — F419 Anxiety disorder, unspecified: Secondary | ICD-10-CM

## 2013-03-31 DIAGNOSIS — Z23 Encounter for immunization: Secondary | ICD-10-CM

## 2013-03-31 MED ORDER — RANITIDINE HCL 150 MG PO TABS: ORAL_TABLET | ORAL | Status: DC

## 2013-03-31 MED ORDER — OMEPRAZOLE 40 MG PO CPDR: 40.0000 mg | DELAYED_RELEASE_CAPSULE | Freq: Every day | ORAL | Status: DC

## 2013-03-31 MED ORDER — ALPRAZOLAM 0.5 MG PO TABS: 0.5000 mg | ORAL_TABLET | Freq: Three times a day (TID) | ORAL | Status: DC | PRN

## 2013-03-31 NOTE — Patient Instructions (Addendum)
Try prilosec 20mg    Take x 2 (or one 40 mg)  30-60 min before first meal of the day and Zantac 150  mg one bedtime until you return to see Dr Kendrick Fries  GERD (REFLUX)  is an extremely common cause of respiratory symptoms, many times with no significant heartburn at all.    It can be treated with medication, but also with lifestyle changes including avoidance of late meals, excessive alcohol, smoking cessation, and avoid fatty foods, chocolate, peppermint, colas, red wine, and acidic juices such as orange juice.  NO MINT OR MENTHOL PRODUCTS SO NO COUGH DROPS  USE SUGARLESS CANDY INSTEAD (jolley ranchers or Stover's)  NO OIL BASED VITAMINS - use powdered substitutes.   Please schedule a follow up office visit in 4 weeks, sooner if needed to see Dr Kendrick Fries on return Late add : consider refer to GI for NF eval

## 2013-03-31 NOTE — Progress Notes (Signed)
  Subjective:    Patient ID: Cheyenne Wong, female    DOB: 31-Mar-1951   MRN: [id number]    Brief patient profile:  This is a very pleasant 62 year old female with a past medical history significant for hypothyroidism and anxiety  eval by Dr Kendrick Fries for ? Asthma but MCT neg 05/27/12   History of Present Illness  03/31/2013 acute  ov/[name] re: ? vcd with neg mct for "breathing never better" for x decades Chief Complaint  Patient presents with  . Acute Visit    pt reports she feels like she is choking, occurs especially at night-- states she feels like there is a golfball stuck in throat--finds relief with propping head on 4 pillows-- still dyspneic regularly    Dx with gerd around 10 y prior to OV  But only takes zantac once a week if eat meat for overt HB. Typically lump is worse after supper and sob is present at rest, some worse when walk  Xanax ?  helping lump or breathing but worse lump sensation at hs and only takes avg of one xanax a month  Cardiology eval completed for atypical cp  03/30/13 > rec rx ppi but did not start   No obvious day to day or daytime variabilty or assoc chronic cough or cp or chest tightness, subjective wheeze overt sinus or hb symptoms. No unusual exp hx or h/o childhood pna/ asthma or knowledge of premature birth.  Sleeping ok without nocturnal  or early am exacerbation  of respiratory  c/o's or need for noct saba. Also denies any obvious fluctuation of symptoms with weather or environmental changes or other aggravating or alleviating factors except as outlined above   Current Medications, Allergies, Complete Past Medical History, Past Surgical History, Family History, and Social History were reviewed in [employer] record.  ROS  The following are not active complaints unless bolded sore throat, dysphagia but no choking on food, dental problems, itching, sneezing,  nasal congestion or excess/ purulent secretions, ear ache,   fever,  chills, sweats, unintended wt loss, pleuritic or exertional cp, hemoptysis,  orthopnea pnd or leg swelling, presyncope, palpitations, heartburn, abdominal pain, anorexia, nausea, vomiting, diarrhea  or change in bowel or urinary habits, change in stools or urine, dysuria,hematuria,  rash, arthralgias, visual complaints, headache, numbness weakness or ataxia or problems with walking or coordination,  change in mood/affect or memory.               Objective:   Physical Exam  Gen: well appearing, no acute distress HEENT: NCAT, PERRL, EOMi, OP clear, neck supple without masses PULM: CTA B CV: RRR, no mgr, no JVD AB: BS+, soft, nontender, no hsm Ext: warm, no edema, no clubbing, no cyanosis Derm: no rash or skin breakdown Neuro: A&Ox4, CN II-XII intact, strength 5/5 in all 4 extremities  05/19/2012 Simple spirometry normal      Assessment & Plan:

## 2013-04-01 NOTE — Assessment & Plan Note (Signed)
Renewed xanax, reviewed approp rx

## 2013-04-01 NOTE — Assessment & Plan Note (Signed)
05/19/2012 simple spirometry normal 05/27/2012 Metacholine challenge normal  Symptoms are markedly disproportionate to objective findings and not clear this is a lung problem but pt does appear to have difficult airway management issues. DDX of  difficult airways managment all start with A and  include Adherence, Ace Inhibitors, Acid Reflux, Active Sinus Disease, Alpha 1 Antitripsin deficiency, Anxiety masquerading as Airways dz,  ABPA,  allergy(esp in young), Aspiration (esp in elderly), Adverse effects of DPI,  Active smokers, plus two Bs  = Bronchiectasis and Beta blocker use..and one C= CHF  ? Gerd> has not been excluded with use of zantac once a week > max rx and diet for at least a month reviewed> See instructions for specific recommendations which were reviewed directly with the patient who was given a copy with highlighter outlining the key components.   ? Anxiety > typically dx of exclusion but much higher on the list and should probably try more often to use the xanax to see to what extent it helps

## 2013-04-09 ENCOUNTER — Encounter: Payer: BC Managed Care – PPO | Admitting: Cardiovascular Disease

## 2013-04-28 ENCOUNTER — Ambulatory Visit: Payer: BC Managed Care – PPO | Admitting: Pulmonary Disease

## 2013-07-03 ENCOUNTER — Telehealth: Payer: Self-pay | Admitting: *Deleted

## 2013-07-03 ENCOUNTER — Other Ambulatory Visit (INDEPENDENT_AMBULATORY_CARE_PROVIDER_SITE_OTHER): Payer: BC Managed Care – PPO

## 2013-07-03 DIAGNOSIS — E039 Hypothyroidism, unspecified: Secondary | ICD-10-CM

## 2013-07-03 LAB — T4, FREE: Free T4: 0.79 ng/dL (ref 0.60–1.60)

## 2013-07-03 NOTE — Telephone Encounter (Signed)
error 

## 2013-07-03 NOTE — Telephone Encounter (Signed)
Also that she hasnt been taking her thyroid medication

## 2013-07-03 NOTE — Telephone Encounter (Signed)
TSH and free T4 244.9

## 2013-07-03 NOTE — Telephone Encounter (Signed)
Pt states she would like TSH and a Vitamin D done?

## 2013-07-03 NOTE — Telephone Encounter (Signed)
Actually, it looks that that was already done. I am not certain what labs that she needs?

## 2013-07-03 NOTE — Telephone Encounter (Signed)
Ok. TSH, Free T4, Vit D 244.9

## 2013-07-03 NOTE — Telephone Encounter (Signed)
What labs and dx?  

## 2013-07-07 ENCOUNTER — Encounter: Payer: Self-pay | Admitting: *Deleted

## 2014-03-08 ENCOUNTER — Telehealth: Payer: Self-pay | Admitting: *Deleted

## 2014-03-09 ENCOUNTER — Ambulatory Visit (INDEPENDENT_AMBULATORY_CARE_PROVIDER_SITE_OTHER): Payer: BC Managed Care – PPO | Admitting: Adult Health

## 2014-03-09 ENCOUNTER — Encounter: Payer: Self-pay | Admitting: Adult Health

## 2014-03-09 VITALS — BP 118/70 | HR 55 | Temp 98.2°F | Resp 14 | Wt 145.0 lb

## 2014-03-09 DIAGNOSIS — R109 Unspecified abdominal pain: Secondary | ICD-10-CM

## 2014-03-09 DIAGNOSIS — R5383 Other fatigue: Secondary | ICD-10-CM | POA: Insufficient documentation

## 2014-03-09 DIAGNOSIS — R5381 Other malaise: Secondary | ICD-10-CM

## 2014-03-09 LAB — HEPATIC FUNCTION PANEL
ALBUMIN: 4 g/dL (ref 3.5–5.2)
ALK PHOS: 65 U/L (ref 39–117)
ALT: 27 U/L (ref 0–35)
AST: 31 U/L (ref 0–37)
Bilirubin, Direct: 0.1 mg/dL (ref 0.0–0.3)
TOTAL PROTEIN: 6.8 g/dL (ref 6.0–8.3)
Total Bilirubin: 0.5 mg/dL (ref 0.2–1.2)

## 2014-03-09 LAB — CBC WITH DIFFERENTIAL/PLATELET
BASOS ABS: 0.1 10*3/uL (ref 0.0–0.1)
Basophils Relative: 0.7 % (ref 0.0–3.0)
EOS ABS: 0.4 10*3/uL (ref 0.0–0.7)
Eosinophils Relative: 5.3 % — ABNORMAL HIGH (ref 0.0–5.0)
HEMATOCRIT: 42.8 % (ref 36.0–46.0)
Hemoglobin: 14.3 g/dL (ref 12.0–15.0)
LYMPHS ABS: 2 10*3/uL (ref 0.7–4.0)
Lymphocytes Relative: 23.8 % (ref 12.0–46.0)
MCHC: 33.4 g/dL (ref 30.0–36.0)
MCV: 87.9 fl (ref 78.0–100.0)
Monocytes Absolute: 0.6 10*3/uL (ref 0.1–1.0)
Monocytes Relative: 6.6 % (ref 3.0–12.0)
Neutro Abs: 5.3 10*3/uL (ref 1.4–7.7)
Neutrophils Relative %: 63.6 % (ref 43.0–77.0)
PLATELETS: 237 10*3/uL (ref 150.0–400.0)
RBC: 4.86 Mil/uL (ref 3.87–5.11)
RDW: 13.4 % (ref 11.5–15.5)
WBC: 8.4 10*3/uL (ref 4.0–10.5)

## 2014-03-09 LAB — BASIC METABOLIC PANEL
BUN: 16 mg/dL (ref 6–23)
CO2: 28 meq/L (ref 19–32)
Calcium: 10.5 mg/dL (ref 8.4–10.5)
Chloride: 104 mEq/L (ref 96–112)
Creatinine, Ser: 0.8 mg/dL (ref 0.4–1.2)
GFR: 81.01 mL/min (ref >60.00)
GLUCOSE: 74 mg/dL (ref 70–99)
Potassium: 4.8 mEq/L (ref 3.5–5.1)
SODIUM: 140 meq/L (ref 135–145)

## 2014-03-09 LAB — TSH: TSH: 2.78 u[IU]/mL (ref 0.35–4.50)

## 2014-03-09 LAB — LIPASE: Lipase: 40 U/L (ref 11.0–59.0)

## 2014-03-09 LAB — AMYLASE: AMYLASE: 107 U/L (ref 27–131)

## 2014-03-09 NOTE — Progress Notes (Signed)
Pre visit review using our clinic review tool, if applicable. No additional management support is needed unless otherwise documented below in the visit note. 

## 2014-03-09 NOTE — Progress Notes (Signed)
Patient ID: Cheyenne Wong, female   DOB: 11-20-1950, 63 y.o.   MRN: [id number]   Subjective:    Patient ID: Cheyenne Wong, female    DOB: 06/12/1951, 63 y.o.   MRN: [id number]  HPI  Pt is a pleasant 64 y/o female who presents to clinic with left sided abdominal pain x 1 month. Describes pain as constant, dull, burning. Rates pain 2/10. Worse with palpation. Reports associated symptoms of fatigue, decreased appetite and bloating. Denies noticing any lymphadenopathy. No changes in bowels. No blood in stool. No difficulty urinating or hematuria. Pertaining to her fatigue, pt has not been taking levothyroxine. Reports that she does not do well with medication and "forgets to take".   Past Medical History  Diagnosis Date  . Panic disorder   . Vitamin D deficiency   . GERD (gastroesophageal reflux disease)   . Right knee meniscal tear     Current Outpatient Prescriptions on File Prior to Visit  Medication Sig Dispense Refill  . ALPRAZolam (XANAX) 0.5 MG tablet Take 1 tablet (0.5 mg total) by mouth 3 (three) times daily as needed for anxiety.  90 tablet  0  . levothyroxine (SYNTHROID, LEVOTHROID) 50 MCG tablet Take 1 tablet (50 mcg total) by mouth daily.  90 tablet  2  . omeprazole (PRILOSEC) 40 MG capsule Take 1 capsule (40 mg total) by mouth daily.  30 capsule  2  . ranitidine (ZANTAC) 150 MG tablet One at bedtime       No current facility-administered medications on file prior to visit.     Review of Systems  Constitutional: Positive for appetite change and fatigue. Negative for fever and chills.  Respiratory: Negative.   Cardiovascular: Negative.   Gastrointestinal: Positive for abdominal pain. Negative for nausea, vomiting, diarrhea, constipation and blood in stool.  Genitourinary: Negative for dysuria, urgency, frequency, hematuria, flank pain and difficulty urinating.  Musculoskeletal: Negative.   Psychiatric/Behavioral: Negative.   All other systems reviewed and are  negative.      Objective:  BP 118/70  Pulse 55  Temp(Src) 98.2 F (36.8 C) (Oral)  Resp 14  Wt 145 lb (65.772 kg)  SpO2 100%   Physical Exam  Constitutional: She is oriented to person, place, and time. No distress.  Cardiovascular: Normal rate and regular rhythm.   Pulmonary/Chest: Effort normal. No respiratory distress.  Abdominal: Soft. Bowel sounds are normal. She exhibits no distension and no mass. There is tenderness (tenderness with palpation over entire left quadrants).  Musculoskeletal: Normal range of motion.  Lymphadenopathy:    She has no cervical adenopathy.  Neurological: She is alert and oriented to person, place, and time.  Skin: Skin is warm and dry.  Psychiatric: She has a normal mood and affect. Her behavior is normal. Judgment and thought content normal.      Assessment & Plan:   1. Left sided abdominal pain ? diverticulitis. She is tender along the left quadrants. I will check labs and send for CT of abdomen. - Basic metabolic panel - Hepatic function panel - CBC with Differential - Amylase - Lipase - CT Abdomen Pelvis W Contrast; Future  2. Other fatigue She has been prescribed levothyroxine for hypothyroidism but reports that she has not been taking. She prefers to take no medication. She is aware that hypothyroidism requires replacement for life. I will check TSH level and adjust medication if necessary. Encouraged her to continue her meds. - TSH

## 2014-03-09 NOTE — Patient Instructions (Signed)
  Please have labs drawn prior to leaving the office.  See Eber Jones in the referral department to schedule your CT of the abdomen.  We will contact you with results of labs and CT once they are available.  In the meantime, stay on a clear liquid diet for 24-48 hours. Drink plenty of fluids to maintain hydration.  You may advance your diet to a bland, easy to digest diet after the 48 hours.

## 2014-03-10 ENCOUNTER — Encounter: Payer: Self-pay | Admitting: *Deleted

## 2014-03-10 ENCOUNTER — Ambulatory Visit: Payer: Self-pay | Admitting: Adult Health

## 2014-03-12 NOTE — Telephone Encounter (Signed)
A user error has taken place.

## 2014-03-16 ENCOUNTER — Telehealth: Payer: Self-pay | Admitting: Adult Health

## 2014-03-16 NOTE — Telephone Encounter (Signed)
Called and advised patient of results,  verbalized understanding. 

## 2014-03-16 NOTE — Telephone Encounter (Signed)
CT shows moderate stool throughout colon. Recommend that she begin miralax daily to help with regular BMs. If stools become too loose she can take miralax every other day or every 3 days.  No bowel obstruction No diverticulitis or colitis

## 2014-03-24 ENCOUNTER — Encounter: Payer: Self-pay | Admitting: Adult Health

## 2015-04-20 ENCOUNTER — Ambulatory Visit: Payer: BC Managed Care – PPO | Admitting: Internal Medicine

## 2015-04-29 ENCOUNTER — Ambulatory Visit (INDEPENDENT_AMBULATORY_CARE_PROVIDER_SITE_OTHER): Payer: BC Managed Care – PPO | Admitting: Internal Medicine

## 2015-04-29 ENCOUNTER — Encounter: Payer: Self-pay | Admitting: Internal Medicine

## 2015-04-29 VITALS — BP 115/74 | HR 91 | Temp 97.9°F | Ht 63.0 in | Wt 148.4 lb

## 2015-04-29 DIAGNOSIS — J069 Acute upper respiratory infection, unspecified: Secondary | ICD-10-CM | POA: Diagnosis not present

## 2015-04-29 DIAGNOSIS — E039 Hypothyroidism, unspecified: Secondary | ICD-10-CM | POA: Diagnosis not present

## 2015-04-29 DIAGNOSIS — R5382 Chronic fatigue, unspecified: Secondary | ICD-10-CM | POA: Insufficient documentation

## 2015-04-29 DIAGNOSIS — B9789 Other viral agents as the cause of diseases classified elsewhere: Secondary | ICD-10-CM

## 2015-04-29 DIAGNOSIS — R7989 Other specified abnormal findings of blood chemistry: Secondary | ICD-10-CM

## 2015-04-29 DIAGNOSIS — Z1239 Encounter for other screening for malignant neoplasm of breast: Secondary | ICD-10-CM | POA: Diagnosis not present

## 2015-04-29 DIAGNOSIS — Z0000 Encounter for general adult medical examination without abnormal findings: Secondary | ICD-10-CM

## 2015-04-29 DIAGNOSIS — E559 Vitamin D deficiency, unspecified: Secondary | ICD-10-CM | POA: Diagnosis not present

## 2015-04-29 LAB — CBC WITH DIFFERENTIAL/PLATELET
Basophils Absolute: 0 10*3/uL (ref 0.0–0.1)
Basophils Relative: 0.5 % (ref 0.0–3.0)
EOS ABS: 0.3 10*3/uL (ref 0.0–0.7)
EOS PCT: 3.9 % (ref 0.0–5.0)
HEMATOCRIT: 42.3 % (ref 36.0–46.0)
HEMOGLOBIN: 14.2 g/dL (ref 12.0–15.0)
LYMPHS PCT: 24.1 % (ref 12.0–46.0)
Lymphs Abs: 1.7 10*3/uL (ref 0.7–4.0)
MCHC: 33.6 g/dL (ref 30.0–36.0)
MCV: 87.1 fl (ref 78.0–100.0)
MONO ABS: 0.5 10*3/uL (ref 0.1–1.0)
Monocytes Relative: 7.3 % (ref 3.0–12.0)
Neutro Abs: 4.4 10*3/uL (ref 1.4–7.7)
Neutrophils Relative %: 64.2 % (ref 43.0–77.0)
Platelets: 246 10*3/uL (ref 150.0–400.0)
RBC: 4.85 Mil/uL (ref 3.87–5.11)
RDW: 13.1 % (ref 11.5–15.5)
WBC: 6.9 10*3/uL (ref 4.0–10.5)

## 2015-04-29 LAB — LIPID PANEL
CHOLESTEROL: 239 mg/dL — AB (ref 0–200)
HDL: 73.4 mg/dL (ref >39.00)
NonHDL: 165.88
TRIGLYCERIDES: 245 mg/dL — AB (ref 0.0–149.0)
Total CHOL/HDL Ratio: 3
VLDL: 49 mg/dL — ABNORMAL HIGH (ref 0.0–40.0)

## 2015-04-29 LAB — COMPREHENSIVE METABOLIC PANEL
ALBUMIN: 4.2 g/dL (ref 3.5–5.2)
ALT: 18 U/L (ref 0–35)
AST: 21 U/L (ref 0–37)
Alkaline Phosphatase: 76 U/L (ref 39–117)
BILIRUBIN TOTAL: 0.4 mg/dL (ref 0.2–1.2)
BUN: 16 mg/dL (ref 6–23)
CALCIUM: 10.3 mg/dL (ref 8.4–10.5)
CO2: 28 mEq/L (ref 19–32)
CREATININE: 0.83 mg/dL (ref 0.40–1.20)
Chloride: 101 mEq/L (ref 96–112)
GFR: 74.01 mL/min (ref >60.00)
Glucose, Bld: 144 mg/dL — ABNORMAL HIGH (ref 70–99)
Potassium: 3.9 mEq/L (ref 3.5–5.1)
Sodium: 139 mEq/L (ref 135–145)
TOTAL PROTEIN: 7.3 g/dL (ref 6.0–8.3)

## 2015-04-29 LAB — LDL CHOLESTEROL, DIRECT: Direct LDL: 130 mg/dL

## 2015-04-29 LAB — VITAMIN B12: VITAMIN B 12: 582 pg/mL (ref 211–911)

## 2015-04-29 LAB — T4, FREE: Free T4: 0.94 ng/dL (ref 0.60–1.60)

## 2015-04-29 LAB — VITAMIN D 25 HYDROXY (VIT D DEFICIENCY, FRACTURES): VITD: 26.09 ng/mL — AB (ref 30.00–100.00)

## 2015-04-29 LAB — TSH: TSH: 2.05 u[IU]/mL (ref 0.35–4.50)

## 2015-04-29 NOTE — Progress Notes (Signed)
Subjective:    Patient ID: Cheyenne Wong, female    DOB: 1951/07/03, 64 y.o.   MRN: [id number]  HPI  64YO female presents for follow up.  URI - Some congestion and swollen glands over last few days. No fever. Grandchildren visited last week. Symptoms are improving. Less cough.  Hypothyroidism - Feels that thyroid is enlarged. Off thyroid medication for about a year. Feels tired most of the time. Notes some temp intolerance. Constipated.  Wt Readings from Last 3 Encounters:  04/29/15 148 lb 6 oz (67.302 kg)  03/09/14 145 lb (65.772 kg)  03/31/13 159 lb 9.6 oz (72.394 kg)   BP Readings from Last 3 Encounters:  04/29/15 115/74  03/09/14 118/70  03/31/13 120/80    Past Medical History  Diagnosis Date  . Panic disorder   . Vitamin D deficiency   . GERD (gastroesophageal reflux disease)   . Right knee meniscal tear    Family History  Problem Relation Age of Onset  . Hyperlipidemia Mother   . Heart disease Mother   . Stroke Mother   . Hypertension Mother   . Depression Mother   . ALS Father   . Cancer Maternal Grandfather 28    Lung Ca  . Heart attack Other   . Early death Other     Due to Heart attacks, multiple cousins & uncles  . Heart disease Other   . Heart disease Maternal Grandmother 60   Past Surgical History  Procedure Laterality Date  . Vaginal delivery      x2  . Knee arthroscopy w/ meniscal repair  2011  . Tubal ligation  1978  . Esophagogastroduodenoscopy  9.22.06    Dr Elliot/Gastric polyp - hyperplastic   . Colonoscopy  2006    Dr Markham Jordan - Tubular adenoma   Social History   Social History  . Marital Status: Married    Spouse Name: N/A  . Number of Children: 2  . Years of Education: N/A   Occupational History  . 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 Smoking status: Never Smoker   . Smokeless tobacco: Never Used  . Alcohol Use: No  . Drug Use: No  . Sexual Activity: Not Asked   Other  Topics Concern  . None   Social History Narrative   Regular Exercise -  Walk 3 days a week x 30 min   Daily Caffeine Use:  1 coffee, 1 soda          Review of Systems  Constitutional: Positive for fatigue. Negative for fever, chills, appetite change and unexpected weight change.  HENT: Positive for congestion, postnasal drip, rhinorrhea, sore throat and trouble swallowing. Negative for ear discharge and ear pain.   Eyes: Negative for visual disturbance.  Respiratory: Positive for cough. Negative for shortness of breath and wheezing.   Cardiovascular: Negative for chest pain and leg swelling.  Gastrointestinal: Positive for constipation. Negative for nausea, vomiting, abdominal pain and diarrhea.  Endocrine: Positive for cold intolerance.  Musculoskeletal: Negative for myalgias and arthralgias.  Skin: Negative for color change and rash.  Neurological: Negative for weakness and headaches.  Hematological: Negative for adenopathy. Does not bruise/bleed easily.  Psychiatric/Behavioral: Negative for sleep disturbance and dysphoric mood. The patient is not nervous/anxious.        Objective:    BP 115/74 mmHg  Pulse 91  Temp(Src) 97.9 F (36.6 C) (Oral)  Ht  (1.6 m)  Wt 148 lb 6 oz (67.302 kg)  BMI 26.29 kg/m2  SpO2 98% Physical Exam  Constitutional: She is oriented to person, place, and time. She appears well-developed and well-nourished. No distress.  HENT:  Head: Normocephalic and atraumatic.  Right Ear: External ear normal.  Left Ear: External ear normal.  Nose: Nose normal.  Mouth/Throat: Oropharynx is clear and moist. No oropharyngeal exudate.  Eyes: Conjunctivae are normal. Pupils are equal, round, and reactive to light. Right eye exhibits no discharge. Left eye exhibits no discharge. No scleral icterus.  Neck: Normal range of motion. Neck supple. No tracheal deviation present. No thyromegaly present.  Cardiovascular: Normal rate, regular rhythm, normal heart sounds  and intact distal pulses.  Exam reveals no gallop and no friction rub.   No murmur heard. Pulmonary/Chest: Effort normal and breath sounds normal. No respiratory distress. She has no wheezes. She has no rales. She exhibits no tenderness.  Musculoskeletal: Normal range of motion. She exhibits no edema or tenderness.  Lymphadenopathy:    She has no cervical adenopathy.  Neurological: She is alert and oriented to person, place, and time. No cranial nerve deficit. She exhibits normal muscle tone. Coordination normal.  Skin: Skin is warm and dry. No rash noted. She is not diaphoretic. No erythema. No pallor.  Psychiatric: She has a normal mood and affect. Her behavior is normal. Judgment and thought content normal.          Assessment & Plan:   Problem List Items Addressed This Visit      Unprioritized   Chronic fatigue    Recent fatigue likely from thyroid dysfunction. However, will also check CBC, B12, CMP with labs. Follow up in 4 weeks.      Relevant Orders   B12   Hypothyroidism - Primary    Non-compliant with thyroid medication. Symptomatic with fatigue, constipation, temp intolerance. Will repeat TSH and Free T4 with labs today, then decide about dosing on Levothyroxine.      Relevant Orders   TSH   T4, free   Screening for breast cancer    Mammogram ordered.      Relevant Orders   MM Digital Screening   Viral URI with cough    Symptoms and exam c/w viral URI. Encouraged rest, adequate fluid intake. Tylenol or Ibuprofen prn. Robitussin prn cough. Follow up prn.      Relevant Orders   CBC with Differential/Platelet   Vitamin D deficiency    Repeat Vit D with labs today.       Other Visit Diagnoses    Routine general medical examination at a health care facility        Relevant Orders    Comprehensive metabolic panel    Lipid panel    Vit D  25 hydroxy (rtn osteoporosis monitoring)        Return in about 4 weeks (around 05/27/2015) for Recheck.

## 2015-04-29 NOTE — Assessment & Plan Note (Signed)
Mammogram ordered

## 2015-04-29 NOTE — Assessment & Plan Note (Signed)
Recent fatigue likely from thyroid dysfunction. However, will also check CBC, B12, CMP with labs. Follow up in 4 weeks.

## 2015-04-29 NOTE — Assessment & Plan Note (Signed)
Symptoms and exam c/w viral URI. Encouraged rest, adequate fluid intake. Tylenol or Ibuprofen prn. Robitussin prn cough. Follow up prn.

## 2015-04-29 NOTE — Assessment & Plan Note (Signed)
Repeat Vit D with labs today. 

## 2015-04-29 NOTE — Assessment & Plan Note (Signed)
Non-compliant with thyroid medication. Symptomatic with fatigue, constipation, temp intolerance. Will repeat TSH and Free T4 with labs today, then decide about dosing on Levothyroxine.

## 2015-04-29 NOTE — Patient Instructions (Addendum)
Labs today.  We will call with results and plan to start back on thyroid medication.  We will also schedule your mammogram.

## 2015-04-29 NOTE — Progress Notes (Signed)
Pre visit review using our clinic review tool, if applicable. No additional management support is needed unless otherwise documented below in the visit note. 

## 2015-05-10 ENCOUNTER — Encounter: Payer: Self-pay | Admitting: Internal Medicine

## 2015-05-26 ENCOUNTER — Ambulatory Visit
Admission: RE | Admit: 2015-05-26 | Discharge: 2015-05-26 | Disposition: A | Discharge status: Home / Self Care | Payer: BC Managed Care – PPO | Source: Ambulatory Visit | Attending: Internal Medicine | Admitting: Internal Medicine

## 2015-05-26 DIAGNOSIS — Z1231 Encounter for screening mammogram for malignant neoplasm of breast: Secondary | ICD-10-CM | POA: Diagnosis not present

## 2015-05-26 DIAGNOSIS — Z1239 Encounter for other screening for malignant neoplasm of breast: Secondary | ICD-10-CM

## 2015-05-26 LAB — HM MAMMOGRAPHY

## 2015-05-31 ENCOUNTER — Ambulatory Visit: Payer: BC Managed Care – PPO | Admitting: Internal Medicine

## 2015-06-02 ENCOUNTER — Ambulatory Visit (INDEPENDENT_AMBULATORY_CARE_PROVIDER_SITE_OTHER): Payer: BC Managed Care – PPO | Admitting: Internal Medicine

## 2015-06-02 ENCOUNTER — Encounter: Payer: Self-pay | Admitting: Internal Medicine

## 2015-06-02 VITALS — BP 122/76 | HR 58 | Temp 97.9°F | Ht 63.0 in | Wt 151.0 lb

## 2015-06-02 DIAGNOSIS — E559 Vitamin D deficiency, unspecified: Secondary | ICD-10-CM

## 2015-06-02 DIAGNOSIS — Z1159 Encounter for screening for other viral diseases: Secondary | ICD-10-CM | POA: Diagnosis not present

## 2015-06-02 DIAGNOSIS — E785 Hyperlipidemia, unspecified: Secondary | ICD-10-CM | POA: Diagnosis not present

## 2015-06-02 DIAGNOSIS — Z23 Encounter for immunization: Secondary | ICD-10-CM | POA: Diagnosis not present

## 2015-06-02 DIAGNOSIS — E039 Hypothyroidism, unspecified: Secondary | ICD-10-CM

## 2015-06-02 NOTE — Assessment & Plan Note (Signed)
Vit D slightly low. Will add Vit D 2000units daily. Recheck Vit D at physical next year.

## 2015-06-02 NOTE — Progress Notes (Signed)
Pre visit review using our clinic review tool, if applicable. No additional management support is needed unless otherwise documented below in the visit note. 

## 2015-06-02 NOTE — Assessment & Plan Note (Signed)
Discussed guideline for checking Hep C. Will check level with labs today.

## 2015-06-02 NOTE — Assessment & Plan Note (Signed)
Reviewed lipids which showed elevated triglycerides. However, sample was not fasting. Will recheck lipids with physical next year.

## 2015-06-02 NOTE — Assessment & Plan Note (Signed)
Thyroid function was normal. In the past, she had been treated for hypothyroidism. Will recheck level at physical next year.

## 2015-06-02 NOTE — Patient Instructions (Signed)
Start Vit D 2000units daily. One brand is [employer].

## 2015-06-02 NOTE — Progress Notes (Signed)
Subjective:    Patient ID: Cheyenne Wong, female    DOB: 03/09/1951, 64 y.o.   MRN: [id number]  HPI  64YO female presents for followup.  Last seen in 04/2015 for viral URI and hypothyroidism.  Labs were remarkable only for hypertriglyceridemia. However, pt was fasting.  Hypothyroidism - Recent TSH was normal. Pt was off medication.   Feeling well. No concerns today.    Wt Readings from Last 3 Encounters:  06/02/15 151 lb (68.493 kg)  04/29/15 148 lb 6 oz (67.302 kg)  03/09/14 145 lb (65.772 kg)   BP Readings from Last 3 Encounters:  06/02/15 122/76  04/29/15 115/74  03/09/14 118/70    Past Medical History  Diagnosis Date  . Panic disorder   . Vitamin D deficiency   . GERD (gastroesophageal reflux disease)   . Right knee meniscal tear    Family History  Problem Relation Age of Onset  . Hyperlipidemia Mother   . Heart disease Mother   . Stroke Mother   . Hypertension Mother   . Depression Mother   . ALS Father   . Cancer Maternal Grandfather 17    Lung Ca  . Heart attack Other   . Early death Other     Due to Heart attacks, multiple cousins & uncles  . Heart disease Other   . Heart disease Maternal Grandmother 74   Past Surgical History  Procedure Laterality Date  . Vaginal delivery      x2  . Knee arthroscopy w/ meniscal repair  2011  . Tubal ligation  1978  . Esophagogastroduodenoscopy  9.22.06    Dr Elliot/Gastric polyp - hyperplastic   . Colonoscopy  2006    Dr Markham Jordan - Tubular adenoma   Social History   Social History  . Marital Status: Married    Spouse Name: N/A  . Number of Children: 2  . Years of Education: N/A   Occupational History  . 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 Smoking status: Never Smoker   . Smokeless tobacco: Never Used  . Alcohol Use: No  . Drug Use: No  . Sexual Activity: Not Asked   Other Topics Concern  . None   Social History Narrative   Regular  Exercise -  Walk 3 days a week x 30 min   Daily Caffeine Use:  1 coffee, 1 soda          Review of Systems  Constitutional: Negative for fever, chills, appetite change, fatigue and unexpected weight change.  Eyes: Negative for visual disturbance.  Respiratory: Negative for shortness of breath.   Cardiovascular: Negative for chest pain and leg swelling.  Gastrointestinal: Negative for nausea, vomiting, abdominal pain, diarrhea and constipation.  Musculoskeletal: Negative for myalgias and arthralgias.  Skin: Negative for color change and rash.  Hematological: Negative for adenopathy. Does not bruise/bleed easily.  Psychiatric/Behavioral: Negative for sleep disturbance and dysphoric mood. The patient is not nervous/anxious.        Objective:    BP 122/76 mmHg  Pulse 58  Temp(Src) 97.9 F (36.6 C) (Oral)  Ht  (1.6 m)  Wt 151 lb (68.493 kg)  BMI 26.76 kg/m2  SpO2 97% Physical Exam  Constitutional: She is oriented to person, place, and time. She appears well-developed and well-nourished. No distress.  HENT:  Head: Normocephalic and atraumatic.  Right Ear: External ear normal.  Left Ear: External ear normal.  Nose: Nose normal.  Mouth/Throat: Oropharynx is clear and moist. No oropharyngeal exudate.  Eyes: Conjunctivae are normal. Pupils are equal, round, and reactive to light. Right eye exhibits no discharge. Left eye exhibits no discharge. No scleral icterus.  Neck: Normal range of motion. Neck supple. No tracheal deviation present. No thyromegaly present.  Cardiovascular: Normal rate, regular rhythm, normal heart sounds and intact distal pulses.  Exam reveals no gallop and no friction rub.   No murmur heard. Pulmonary/Chest: Effort normal and breath sounds normal. No respiratory distress. She has no wheezes. She has no rales. She exhibits no tenderness.  Musculoskeletal: Normal range of motion. She exhibits no edema or tenderness.  Lymphadenopathy:    She has no cervical  adenopathy.  Neurological: She is alert and oriented to person, place, and time. No cranial nerve deficit. She exhibits normal muscle tone. Coordination normal.  Skin: Skin is warm and dry. No rash noted. She is not diaphoretic. No erythema. No pallor.  Psychiatric: She has a normal mood and affect. Her behavior is normal. Judgment and thought content normal.          Assessment & Plan:   Problem List Items Addressed This Visit      Unprioritized   Hyperlipidemia    Reviewed lipids which showed elevated triglycerides. However, sample was not fasting. Will recheck lipids with physical next year.      Hypothyroidism - Primary    Thyroid function was normal. In the past, she had been treated for hypothyroidism. Will recheck level at physical next year.      Need for hepatitis C screening test    Discussed guideline for checking Hep C. Will check level with labs today.      Relevant Orders   Hepatitis C Antibody   Vitamin D deficiency    Vit D slightly low. Will add Vit D 2000units daily. Recheck Vit D at physical next year.          Return in about 6 months (around 11/30/2015) for Physical.

## 2015-06-03 LAB — HEPATITIS C ANTIBODY: HCV AB: NEGATIVE

## 2015-11-28 ENCOUNTER — Encounter: Payer: BC Managed Care – PPO | Admitting: Internal Medicine

## 2016-09-17 ENCOUNTER — Other Ambulatory Visit: Payer: Self-pay | Admitting: Internal Medicine

## 2016-09-17 DIAGNOSIS — R05 Cough: Secondary | ICD-10-CM

## 2016-09-17 DIAGNOSIS — J849 Interstitial pulmonary disease, unspecified: Secondary | ICD-10-CM

## 2016-09-17 DIAGNOSIS — R059 Cough, unspecified: Secondary | ICD-10-CM

## 2016-09-17 DIAGNOSIS — Z1231 Encounter for screening mammogram for malignant neoplasm of breast: Secondary | ICD-10-CM

## 2016-09-17 DIAGNOSIS — R0789 Other chest pain: Secondary | ICD-10-CM

## 2016-09-26 ENCOUNTER — Ambulatory Visit
Admission: RE | Admit: 2016-09-26 | Discharge: 2016-09-26 | Disposition: A | Discharge status: Home / Self Care | Payer: BC Managed Care – PPO | Source: Ambulatory Visit | Attending: Internal Medicine | Admitting: Internal Medicine

## 2016-09-26 DIAGNOSIS — K8020 Calculus of gallbladder without cholecystitis without obstruction: Secondary | ICD-10-CM | POA: Diagnosis not present

## 2016-09-26 DIAGNOSIS — R05 Cough: Secondary | ICD-10-CM | POA: Insufficient documentation

## 2016-09-26 DIAGNOSIS — R0789 Other chest pain: Secondary | ICD-10-CM | POA: Diagnosis present

## 2016-09-26 DIAGNOSIS — R059 Cough, unspecified: Secondary | ICD-10-CM

## 2016-09-26 DIAGNOSIS — J849 Interstitial pulmonary disease, unspecified: Secondary | ICD-10-CM | POA: Insufficient documentation

## 2016-10-12 ENCOUNTER — Ambulatory Visit
Admission: RE | Admit: 2016-10-12 | Discharge: 2016-10-12 | Disposition: A | Discharge status: Home / Self Care | Payer: BC Managed Care – PPO | Source: Ambulatory Visit | Attending: Internal Medicine | Admitting: Internal Medicine

## 2016-10-12 DIAGNOSIS — Z1231 Encounter for screening mammogram for malignant neoplasm of breast: Secondary | ICD-10-CM | POA: Diagnosis present

## 2016-10-15 DIAGNOSIS — J8410 Pulmonary fibrosis, unspecified: Secondary | ICD-10-CM | POA: Insufficient documentation

## 2017-04-01 DIAGNOSIS — R1319 Other dysphagia: Secondary | ICD-10-CM | POA: Insufficient documentation

## 2017-04-30 ENCOUNTER — Other Ambulatory Visit: Payer: Self-pay | Admitting: Student

## 2017-04-30 DIAGNOSIS — R1310 Dysphagia, unspecified: Secondary | ICD-10-CM

## 2017-05-06 ENCOUNTER — Ambulatory Visit
Admission: RE | Admit: 2017-05-06 | Discharge: 2017-05-06 | Disposition: A | Discharge status: Home / Self Care | Payer: BC Managed Care – PPO | Source: Ambulatory Visit | Attending: Student | Admitting: Student

## 2017-05-06 DIAGNOSIS — K219 Gastro-esophageal reflux disease without esophagitis: Secondary | ICD-10-CM | POA: Insufficient documentation

## 2017-05-06 DIAGNOSIS — R1310 Dysphagia, unspecified: Secondary | ICD-10-CM | POA: Diagnosis present

## 2017-05-06 DIAGNOSIS — F458 Other somatoform disorders: Secondary | ICD-10-CM | POA: Insufficient documentation

## 2017-05-06 DIAGNOSIS — K449 Diaphragmatic hernia without obstruction or gangrene: Secondary | ICD-10-CM | POA: Insufficient documentation

## 2017-06-03 ENCOUNTER — Other Ambulatory Visit: Payer: Self-pay | Admitting: Specialist

## 2017-06-03 DIAGNOSIS — R053 Chronic cough: Secondary | ICD-10-CM

## 2017-06-03 DIAGNOSIS — R05 Cough: Secondary | ICD-10-CM

## 2017-06-03 DIAGNOSIS — R0609 Other forms of dyspnea: Secondary | ICD-10-CM

## 2017-06-03 DIAGNOSIS — J84112 Idiopathic pulmonary fibrosis: Secondary | ICD-10-CM

## 2017-06-21 ENCOUNTER — Ambulatory Visit: Payer: BC Managed Care – PPO

## 2017-07-02 ENCOUNTER — Ambulatory Visit
Admission: RE | Admit: 2017-07-02 | Discharge: 2017-07-02 | Disposition: A | Discharge status: Home / Self Care | Payer: BC Managed Care – PPO | Source: Ambulatory Visit | Attending: Specialist | Admitting: Specialist

## 2017-07-02 DIAGNOSIS — K8020 Calculus of gallbladder without cholecystitis without obstruction: Secondary | ICD-10-CM | POA: Insufficient documentation

## 2017-07-02 DIAGNOSIS — R0609 Other forms of dyspnea: Secondary | ICD-10-CM | POA: Insufficient documentation

## 2017-07-02 DIAGNOSIS — J8489 Other specified interstitial pulmonary diseases: Secondary | ICD-10-CM | POA: Insufficient documentation

## 2017-07-02 DIAGNOSIS — J84112 Idiopathic pulmonary fibrosis: Secondary | ICD-10-CM

## 2017-07-02 DIAGNOSIS — I700 Atherosclerosis of aorta: Secondary | ICD-10-CM | POA: Diagnosis not present

## 2017-07-02 DIAGNOSIS — R053 Chronic cough: Secondary | ICD-10-CM

## 2017-07-02 DIAGNOSIS — R05 Cough: Secondary | ICD-10-CM | POA: Insufficient documentation

## 2017-09-03 LAB — PULMONARY FUNCTION TEST

## 2018-01-09 ENCOUNTER — Other Ambulatory Visit
Admission: RE | Admit: 2018-01-09 | Discharge: 2018-01-09 | Disposition: A | Discharge status: Home / Self Care | Payer: BC Managed Care – PPO | Source: Ambulatory Visit | Attending: Specialist | Admitting: Specialist

## 2018-01-09 DIAGNOSIS — R0602 Shortness of breath: Secondary | ICD-10-CM | POA: Insufficient documentation

## 2018-01-09 LAB — FIBRIN DERIVATIVES D-DIMER (ARMC ONLY): Fibrin derivatives D-dimer (ARMC): 296.69 ng/mL (FEU) (ref 0.00–499.00)

## 2018-06-20 ENCOUNTER — Other Ambulatory Visit: Payer: Self-pay | Admitting: Specialist

## 2018-06-20 DIAGNOSIS — J849 Interstitial pulmonary disease, unspecified: Secondary | ICD-10-CM

## 2018-09-15 ENCOUNTER — Other Ambulatory Visit: Payer: Self-pay | Admitting: Specialist

## 2018-09-15 DIAGNOSIS — J849 Interstitial pulmonary disease, unspecified: Secondary | ICD-10-CM

## 2018-12-16 ENCOUNTER — Ambulatory Visit
Admission: RE | Admit: 2018-12-16 | Discharge: 2018-12-16 | Disposition: A | Discharge status: Home / Self Care | Payer: Medicare Other | Source: Ambulatory Visit | Attending: Specialist | Admitting: Specialist

## 2018-12-16 ENCOUNTER — Other Ambulatory Visit: Payer: Self-pay

## 2018-12-16 DIAGNOSIS — J849 Interstitial pulmonary disease, unspecified: Secondary | ICD-10-CM | POA: Diagnosis not present

## 2018-12-22 ENCOUNTER — Other Ambulatory Visit
Admission: RE | Admit: 2018-12-22 | Discharge: 2018-12-22 | Disposition: A | Discharge status: Home / Self Care | Payer: Medicare Other | Source: Ambulatory Visit | Attending: Internal Medicine | Admitting: Internal Medicine

## 2018-12-22 DIAGNOSIS — R091 Pleurisy: Secondary | ICD-10-CM | POA: Insufficient documentation

## 2018-12-22 LAB — FIBRIN DERIVATIVES D-DIMER (ARMC ONLY): Fibrin derivatives D-dimer (ARMC): 317.86 ng/mL (FEU) (ref 0.00–499.00)

## 2019-04-21 ENCOUNTER — Other Ambulatory Visit: Payer: Self-pay

## 2019-04-21 DIAGNOSIS — Z20822 Contact with and (suspected) exposure to covid-19: Secondary | ICD-10-CM

## 2019-04-21 DIAGNOSIS — Z20828 Contact with and (suspected) exposure to other viral communicable diseases: Secondary | ICD-10-CM

## 2019-04-23 LAB — NOVEL CORONAVIRUS, NAA: SARS-CoV-2, NAA: NOT DETECTED

## 2019-07-29 DIAGNOSIS — R7303 Prediabetes: Secondary | ICD-10-CM | POA: Insufficient documentation

## 2019-08-04 ENCOUNTER — Emergency Department: Payer: Medicare Other

## 2019-08-04 ENCOUNTER — Encounter: Payer: Self-pay | Admitting: Emergency Medicine

## 2019-08-04 ENCOUNTER — Other Ambulatory Visit: Payer: Self-pay

## 2019-08-04 ENCOUNTER — Emergency Department
Admission: EM | Admit: 2019-08-04 | Discharge: 2019-08-04 | Disposition: A | Discharge status: Home / Self Care | Payer: Medicare Other | Attending: Emergency Medicine | Admitting: Emergency Medicine

## 2019-08-04 DIAGNOSIS — M79602 Pain in left arm: Secondary | ICD-10-CM | POA: Diagnosis not present

## 2019-08-04 DIAGNOSIS — R0789 Other chest pain: Secondary | ICD-10-CM | POA: Diagnosis not present

## 2019-08-04 DIAGNOSIS — R05 Cough: Secondary | ICD-10-CM | POA: Insufficient documentation

## 2019-08-04 DIAGNOSIS — M542 Cervicalgia: Secondary | ICD-10-CM | POA: Diagnosis present

## 2019-08-04 DIAGNOSIS — E039 Hypothyroidism, unspecified: Secondary | ICD-10-CM | POA: Insufficient documentation

## 2019-08-04 HISTORY — DX: Hyperparathyroidism, unspecified: E21.3

## 2019-08-04 HISTORY — DX: Idiopathic pulmonary fibrosis: J84.112

## 2019-08-04 LAB — BASIC METABOLIC PANEL
Anion gap: 12 (ref 5–15)
BUN: 16 mg/dL (ref 8–23)
CO2: 28 mmol/L (ref 22–32)
Calcium: 10.3 mg/dL (ref 8.9–10.3)
Chloride: 101 mmol/L (ref 98–111)
Creatinine, Ser: 0.89 mg/dL (ref 0.44–1.00)
GFR calc Af Amer: >60 mL/min (ref >60)
GFR calc non Af Amer: >60 mL/min (ref >60)
Glucose, Bld: 100 mg/dL — ABNORMAL HIGH (ref 70–99)
Potassium: 4 mmol/L (ref 3.5–5.1)
Sodium: 141 mmol/L (ref 135–145)

## 2019-08-04 LAB — TROPONIN I (HIGH SENSITIVITY)
Troponin I (High Sensitivity): <2 ng/L (ref <18)
Troponin I (High Sensitivity): <2 ng/L (ref <18)

## 2019-08-04 LAB — CBC
HCT: 44.7 % (ref 36.0–46.0)
Hemoglobin: 14.5 g/dL (ref 12.0–15.0)
MCH: 28.4 pg (ref 26.0–34.0)
MCHC: 32.4 g/dL (ref 30.0–36.0)
MCV: 87.6 fL (ref 80.0–100.0)
Platelets: 246 10*3/uL (ref 150–400)
RBC: 5.1 MIL/uL (ref 3.87–5.11)
RDW: 12.8 % (ref 11.5–15.5)
WBC: 9.5 10*3/uL (ref 4.0–10.5)
nRBC: 0 % (ref 0.0–0.2)

## 2019-08-04 NOTE — ED Provider Notes (Signed)
[facility] Emergency Department Provider Note  ____________________________________________  Time seen: Approximately 1:56 PM  I have reviewed the triage vital signs and the nursing notes.   HISTORY  Chief Complaint Chest Pain    HPI Cheyenne Wong is a 69 y.o. female with a history of GERD, hyperparathyroidism, chronic interstitial pneumonitis who comes the ED due to left neck and arm pain that started last night after a coughing fit.  She has frequent coughing due to her pulmonary fibrosis which has been stable over time.  No exertional symptoms, not pleuritic.  No shortness of breath diaphoresis or vomiting.  No dizziness or syncope.  At home she took 3 baby aspirin with resolution of her symptoms.      Past Medical History:  Diagnosis Date  . GERD (gastroesophageal reflux disease)   . Hyperparathyroidism (HCC)   . Panic disorder   . Right knee meniscal tear   . UIP (usual interstitial pneumonitis) (HCC)   . Vitamin D deficiency      Patient Active Problem List   Diagnosis Date Noted  . Need for hepatitis C screening test 06/02/2015  . Screening for breast cancer 04/29/2015  . Hypothyroidism 08/26/2012  . Vitamin D deficiency 10/02/2011  . Hyperlipidemia 09/04/2011     Past Surgical History:  Procedure Laterality Date  . COLONOSCOPY  2006   Dr Markham Jordan - Tubular adenoma  . ESOPHAGOGASTRODUODENOSCOPY  9.22.06   Dr Elliot/Gastric polyp - hyperplastic   . KNEE ARTHROSCOPY W/ MENISCAL REPAIR  2011  . TUBAL LIGATION  1978  . VAGINAL DELIVERY     x2     Prior to Admission medications   Medication Sig Start Date End Date Taking? Authorizing Provider  Naproxen Sodium (ALEVE) 220 MG CAPS Take 220 mg by mouth daily.    [provider]  omeprazole (PRILOSEC) 40 MG capsule Take 1 capsule (40 mg total) by mouth daily. 03/31/13   Nyoka Cowden, MD  ranitidine (ZANTAC) 150 MG tablet One at bedtime 03/31/13   Nyoka Cowden, MD      Allergies Tramadol and Penicillins   Family History  Problem Relation Age of Onset  . Hyperlipidemia Mother   . Heart disease Mother   . Stroke Mother   . Hypertension Mother   . Depression Mother   . ALS Father   . Heart disease Maternal Grandmother 80  . Cancer Maternal Grandfather 25       Lung Ca  . Heart attack Other   . Early death Other        Due to Heart attacks, multiple cousins & uncles  . Heart disease Other     Social History Social History   Tobacco Use  . Smoking status: Never Smoker  . Smokeless tobacco: Never Used  Substance Use Topics  . Alcohol use: No  . Drug use: No    Review of Systems  Constitutional:   No fever or chills.  ENT:   No sore throat. No rhinorrhea. Cardiovascular:   No chest pain or syncope. Respiratory:   No dyspnea or cough. Gastrointestinal:   Negative for abdominal pain, vomiting and diarrhea.  Musculoskeletal:   Positive left neck pain and arm pain as above All other systems reviewed and are negative except as documented above in ROS and HPI.  ____________________________________________   PHYSICAL EXAM:  VITAL SIGNS: ED Triage Vitals [08/04/19 1051]  Enc Vitals Group     BP 114/69     Pulse Rate 77  Resp 18     Temp 98.2 F (36.8 C)     Temp Source Oral     SpO2 98 %     Weight 140 lb (63.5 kg)     Height 5\' 3"  (1.6 m)     Head Circumference      Peak Flow      Pain Score 3     Pain Loc      Pain Edu?      Excl. in [place]?     Vital signs reviewed, nursing assessments reviewed.   Constitutional:   Alert and oriented. Non-toxic appearance. Eyes:   Conjunctivae are normal. EOMI. PERRL. ENT      Head:   Normocephalic and atraumatic.      Nose:   Wearing a mask.      Mouth/Throat:   Wearing a mask.      Neck:   No meningismus. Full ROM.  Left supraclavicular tenderness of the platysmas muscle reproducing her pain. Hematological/Lymphatic/Immunilogical:   No cervical  lymphadenopathy. Cardiovascular:   RRR. Symmetric bilateral radial and DP pulses.  No murmurs. Cap refill less than 2 seconds. Respiratory:   Normal respiratory effort without tachypnea/retractions. Breath sounds are clear and equal bilaterally. No wheezes/rales/rhonchi. Musculoskeletal:   Normal range of motion in all extremities. No joint effusions.  No lower extremity tenderness.  No edema. Neurologic:   Normal speech and language.  Motor grossly intact. No acute focal neurologic deficits are appreciated.  Skin:    Skin is warm, dry and intact. No rash noted.  No petechiae, purpura, or bullae.  ____________________________________________    LABS (pertinent positives/negatives) (all labs ordered are listed, but only abnormal results are displayed) Labs Reviewed  BASIC METABOLIC PANEL - Abnormal; Notable for the following components:      Result Value   Glucose, Bld 100 (*)    All other components within normal limits  CBC  TROPONIN I (HIGH SENSITIVITY)  TROPONIN I (HIGH SENSITIVITY)   ____________________________________________   EKG  Interpreted by me  Date: 08/04/2019  Rate: 76  Rhythm: normal sinus rhythm  QRS Axis: normal  Intervals: normal  ST/T Wave abnormalities: normal  Conduction Disutrbances: none  Narrative Interpretation: unremarkable      ____________________________________________    RADIOLOGY  DG Chest 2 View  Result Date: 08/04/2019 CLINICAL DATA:  Chest/left arm pain, chronic shortness of breath EXAM: CHEST - 2 VIEW COMPARISON:  High-resolution CT chest dated 62,020 FINDINGS: Subpleural reticulation/fibrosis in the lungs bilaterally, suggesting chronic interstitial lung disease, better evaluated on prior CT. No focal consolidation. No pleural effusion or pneumothorax. The heart is normal in size. Visualized osseous structures are within normal limits. IMPRESSION: No evidence of acute cardiopulmonary disease. Chronic interstitial lung disease,  better evaluated on prior CT. Electronically Signed   By: Julian Hy M.D.   On: 08/04/2019 11:38    ____________________________________________   PROCEDURES Procedures  ____________________________________________  DIFFERENTIAL DIAGNOSIS   Non-STEMI, chest wall pain/muscular strain, chronic pulmonary fibrosis symptoms  CLINICAL IMPRESSION / ASSESSMENT AND PLAN / ED COURSE  Medications ordered in the ED: Medications - No data to display  Pertinent labs & imaging results that were available during my care of the patient were reviewed by me and considered in my medical decision making (see chart for details).  Cheyenne Wong was evaluated in Emergency Department on 08/04/2019 for the symptoms described in the history of present illness. She was evaluated in the context of the global COVID-19 pandemic, which necessitated consideration  that the patient might be at risk for infection with the SARS-CoV-2 virus that causes COVID-19. Institutional protocols and algorithms that pertain to the evaluation of patients at risk for COVID-19 are in a state of rapid change based on information released by regulatory bodies including the CDC and federal and state organizations. These policies and algorithms were followed during the patient's care in the ED.   Patient with a history of interstitial pneumonitis, hyperparathyroidism, frequent coughing comes the ED due to left arm and neck pain that started this morning after a coughing fit last night.  Pain is now resolved.  It is reproducible with muscular tenderness on exam.Considering the patient's symptoms, medical history, and physical examination today, I have low suspicion for ACS, PE, TAD, pneumothorax, carditis, mediastinitis, pneumonia, CHF, or sepsis.  Chest x-ray, EKG, serial troponins all unremarkable.  Stable for discharge home and outpatient follow-up.      ____________________________________________   FINAL CLINICAL  IMPRESSION(S) / ED DIAGNOSES    Final diagnoses:  Chest wall pain     ED Discharge Orders    None      Portions of this note were generated with dragon dictation software. Dictation errors may occur despite best attempts at proofreading.   Sharman Cheek, MD 08/04/19 1400

## 2019-08-04 NOTE — ED Triage Notes (Signed)
Here for left arm pain that started yesterday.  Has had some tightness in chest that also goes to neck today.  Chronic SHOB but nothing worse than baseline.  Unlabored. VSS.  No fever.

## 2019-08-04 NOTE — ED Notes (Signed)
Pt states having a pain in her left arm and neck that started this morning. Pt cap refil < 3 and pulse strong on left arm. Pt denies pain on palpation.

## 2019-08-04 NOTE — Discharge Instructions (Signed)
Your ekg, chest xray, and lab tests today were all reassuring.

## 2019-08-04 NOTE — ED Notes (Signed)
EDP, [place] at bedside; update provided.

## 2019-08-14 ENCOUNTER — Other Ambulatory Visit: Payer: Self-pay | Admitting: Internal Medicine

## 2019-08-14 DIAGNOSIS — K219 Gastro-esophageal reflux disease without esophagitis: Secondary | ICD-10-CM

## 2019-08-14 DIAGNOSIS — R1310 Dysphagia, unspecified: Secondary | ICD-10-CM

## 2019-08-26 ENCOUNTER — Ambulatory Visit
Admission: RE | Admit: 2019-08-26 | Discharge: 2019-08-26 | Disposition: A | Discharge status: Home / Self Care | Payer: Medicare Other | Source: Ambulatory Visit | Attending: Internal Medicine | Admitting: Internal Medicine

## 2019-08-26 ENCOUNTER — Other Ambulatory Visit: Payer: Self-pay

## 2019-08-26 DIAGNOSIS — K219 Gastro-esophageal reflux disease without esophagitis: Secondary | ICD-10-CM | POA: Diagnosis present

## 2019-08-26 DIAGNOSIS — R1310 Dysphagia, unspecified: Secondary | ICD-10-CM | POA: Diagnosis present

## 2019-08-26 NOTE — Therapy (Signed)
[place], [place], [address] Phone: [phone]   Fax:     Modified Barium Swallow  Patient Details  Name: Cheyenne Wong MRN: [id number] Date of Birth: 1950/12/29 No data recorded  Encounter Date: 08/26/2019  End of Session - 08/26/19 1503    Visit Number  1    Number of Visits  1    Date for SLP Re-Evaluation  08/26/19    SLP Start Time  34    SLP Stop Time   1315    SLP Time Calculation (min)  45 min    Activity Tolerance  Patient tolerated treatment well       Past Medical History:  Diagnosis Date  . GERD (gastroesophageal reflux disease)   . Hyperparathyroidism ([place])   . Panic disorder   . Right knee meniscal tear   . 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 Vitamin D deficiency     Past Surgical History:  Procedure Laterality Date  . COLONOSCOPY  2006   Dr Tiffany Kocher - Tubular adenoma  . ESOPHAGOGASTRODUODENOSCOPY  9.22.06   Dr Elliot/Gastric polyp - hyperplastic   . KNEE ARTHROSCOPY W/ MENISCAL REPAIR  2011  . TUBAL LIGATION  1978  . VAGINAL DELIVERY     x2    There were no vitals filed for this visit.   Subjective: Patient behavior: (alertness, ability to follow instructions, etc.):  The patient is alert, able to verbalize her swallowing history, and follow directions.  Chief complaint: The patient states that food or liquid "gets hung up" so she coughs vigorously until it comes up.   Objective:  Radiological Procedure: A videoflouroscopic evaluation of oral-preparatory, reflex initiation, and pharyngeal phases of the swallow was performed; as well as a screening of the upper esophageal phase.  I. POSTURE: Upright in MBS chair  II. VIEW: Lateral  III. COMPENSATORY STRATEGIES: Chin tuck and large bolus reduce laryngeal penetration  IV. BOLUSES ADMINISTERED:   Thin Liquid: 4 small, 4 large   Nectar-thick Liquid: 1 large   Honey-thick Liquid:  DNT   Puree: 2 teaspoon presentations   Mechanical Soft: 1/4 graham cracker in applesauce  V. RESULTS OF EVALUATION: A. ORAL PREPARATORY PHASE: (The lips, tongue, and velum are observed for strength and coordination)       **Overall Severity Rating: within normal limits   B. SWALLOW INITIATION/REFLEX: (The reflex is normal if "triggered" by the time the bolus reached the base of the tongue)  **Overall Severity Rating: Mild; triggers while falling from the valleculae to the pyriform sinuses  C. PHARYNGEAL PHASE: (Pharyngeal function is normal if the bolus shows rapid, smooth, and continuous transit through the pharynx and there is no pharyngeal residue after the swallow)  **Overall Severity Rating: Mild; reduced pharyngeal pressure generation, mild vallecular residue   D. LARYNGEAL PENETRATION: (Material entering into the laryngeal inlet/vestibule but not aspirated) Transient with thin liquids  E. ASPIRATION: None  F. ESOPHAGEAL PHASE: (Screening of the upper esophagus) No observed abnormality within the viewable cervical esophagus  ASSESSMENT: This 69 year old woman; with interstitial lung disease, GERD, and worsening coughing after eating/drinking; is presenting with mild oropharyngeal dysphagia characterized by delayed pharyngeal swallow initiation, reduced pharyngeal pressure generation, trace-to-mild vallecular residue, and transient laryngeal penetration with thin liquids.  Oral control of the bolus including oral hold, rotary mastication, and anterior to posterior transfer is within functional limits.   Delayed pharyngeal swallow initiation and reduced pharyngeal pressure generation are consistent with effects  of laryngopharyngeal reflux (inflammation, edema, and resultant decreased sensation of the larynx and pharynx).  The patient is at risk for trace aspiration to which she has a strong protective cough, per her description.  The patient describes her coughing as occurring after  eating/drinking suggesting a hypersensitive cough response, possibly due to reflux.    PLAN/RECOMMENDATIONS:   A. Diet: Regular   B. Swallowing Precautions: Reflux precautions   C. Recommended consultation to: GI   D. Therapy recommendations: speech therapy is not indicated at this time   E. Results and recommendations were discussed with the patient immediatly after the study and the final report routed to the referring MD.     Dysphagia, unspecified type - Plan: DG SWALLOW FUNC OP MEDICARE SPEECH PATH, DG SWALLOW FUNC OP MEDICARE SPEECH PATH  Gastroesophageal reflux disease, unspecified whether esophagitis present - Plan: DG SWALLOW FUNC OP MEDICARE SPEECH PATH, DG SWALLOW FUNC OP MEDICARE SPEECH PATH        Problem List Patient Active Problem List   Diagnosis Date Noted  . Need for hepatitis C screening test 06/02/2015  . Screening for breast cancer 04/29/2015  . Hypothyroidism 08/26/2012  . Vitamin D deficiency 10/02/2011  . Hyperlipidemia 09/04/2011   Dollene Primrose, MS/CCC- SLP  Leandrew Koyanagi 08/26/2019, 3:04 PM  [facility] DI[facility] [address] [place], [place], [address] Phone: [phone]   Fax:     Name: LAQUANTA HUMMEL MRN: [id number] Date of Birth: [birth year]

## 2019-11-19 ENCOUNTER — Other Ambulatory Visit: Payer: Self-pay | Admitting: Specialist

## 2019-11-19 DIAGNOSIS — J849 Interstitial pulmonary disease, unspecified: Secondary | ICD-10-CM

## 2020-01-11 ENCOUNTER — Other Ambulatory Visit: Payer: Self-pay

## 2020-01-11 ENCOUNTER — Ambulatory Visit
Admission: RE | Admit: 2020-01-11 | Discharge: 2020-01-11 | Disposition: A | Discharge status: Home / Self Care | Payer: Medicare Other | Source: Ambulatory Visit | Attending: Specialist | Admitting: Specialist

## 2020-01-11 DIAGNOSIS — J849 Interstitial pulmonary disease, unspecified: Secondary | ICD-10-CM | POA: Diagnosis present

## 2021-02-14 ENCOUNTER — Other Ambulatory Visit: Payer: Self-pay | Admitting: Internal Medicine

## 2021-02-14 DIAGNOSIS — Z1231 Encounter for screening mammogram for malignant neoplasm of breast: Secondary | ICD-10-CM

## 2021-02-14 IMAGING — CT CT CHEST HIGH RESOLUTION WITHOUT CONTRAST
2 of 7 series · 15 of 36 positions shown, 18 images · non-contrast
Comparison: 07/02/2017, 09/26/2016

CLINICAL DATA: Persistent dry cough and shortness of breath, normal
PFTs

EXAM:
CT CHEST WITHOUT CONTRAST
TECHNIQUE: Multidetector CT imaging of the chest was performed following the
standard protocol without intravenous contrast. High resolution
imaging of the lungs, as well as inspiratory and expiratory imaging,
was performed.

[Series 4: thorax · coronal · 0.54mm/px · 3 of 132 slices shown]
[im 27/132  lung]
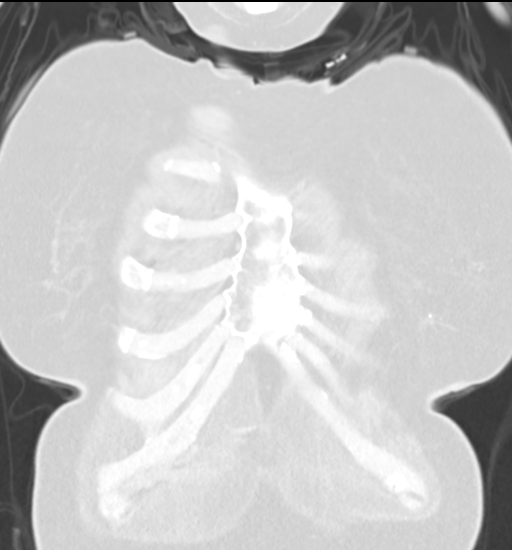
[im 53/132  lung]
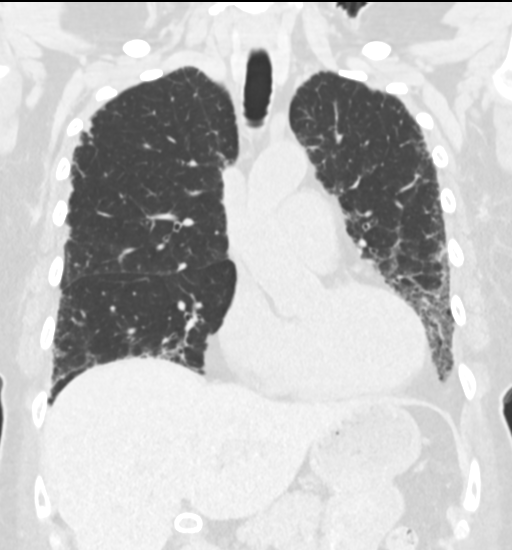
[im 79/132  lung]
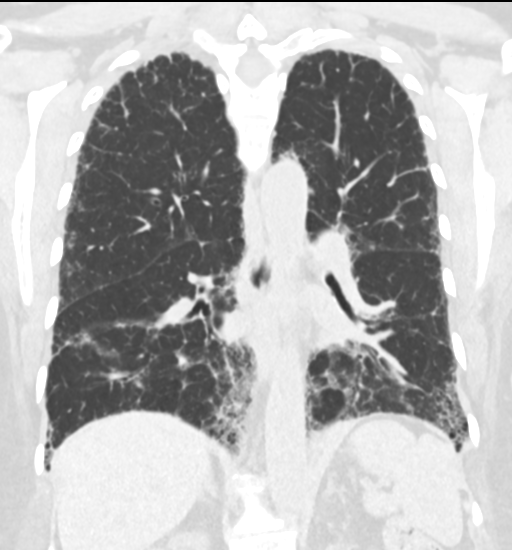

[Series 11: high res (id) thorax · axial · 0.52mm/px · z∈[-1220,-969]mm · 12 of 297 slices shown, 15 images]
[im 23/297  mediastinal]
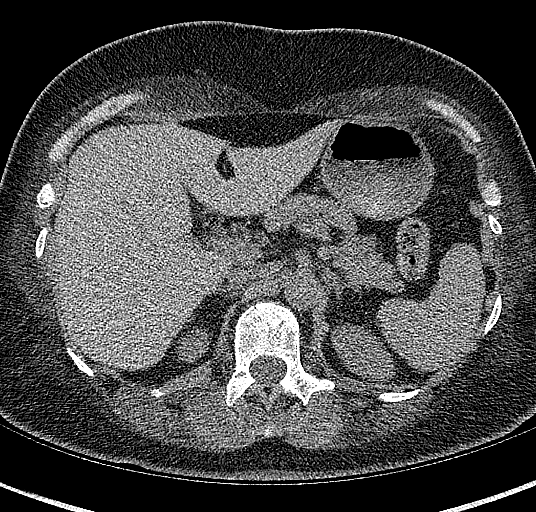
[im 23/297  lung]
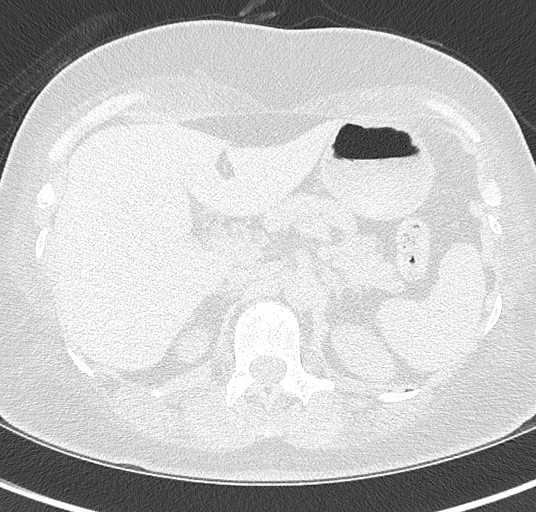
[im 46/297  lung]
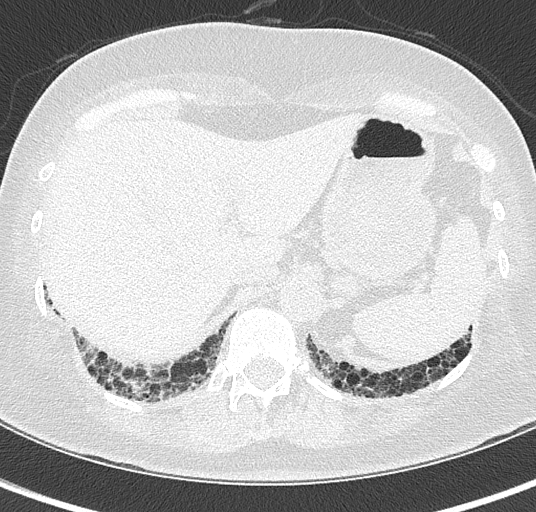
[im 69/297  lung]
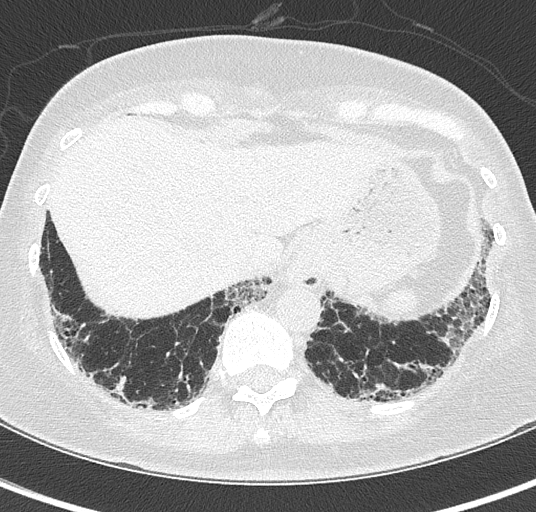
[im 92/297  lung]
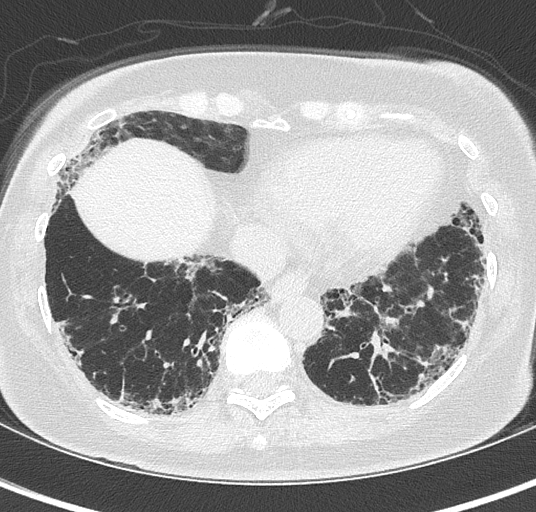
[im 114/297  mediastinal]
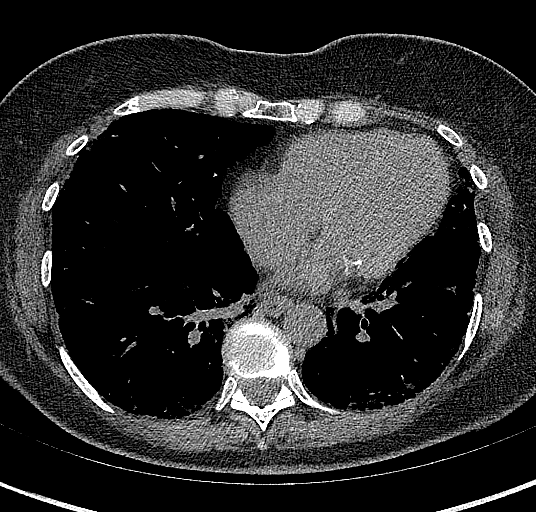
[im 114/297  lung]
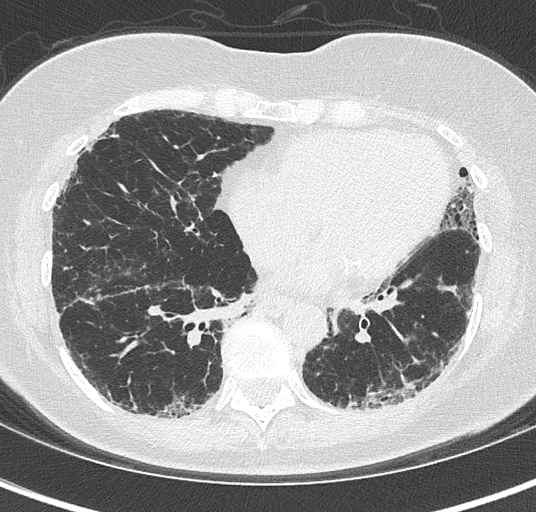
[im 137/297  lung]
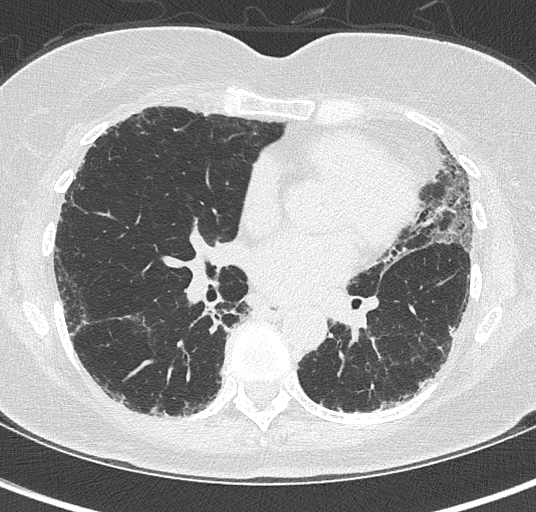
[im 160/297  lung]
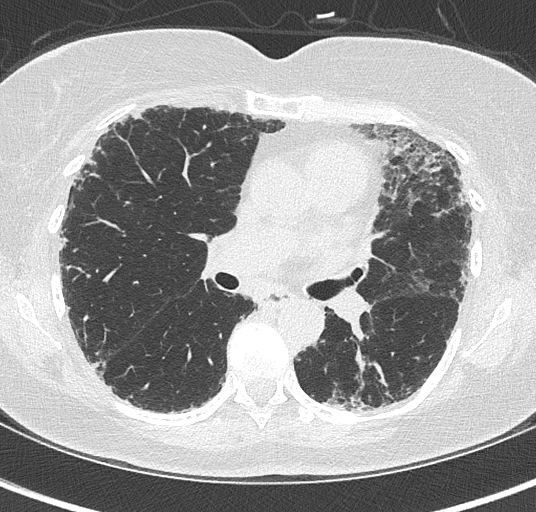
[im 183/297  lung]
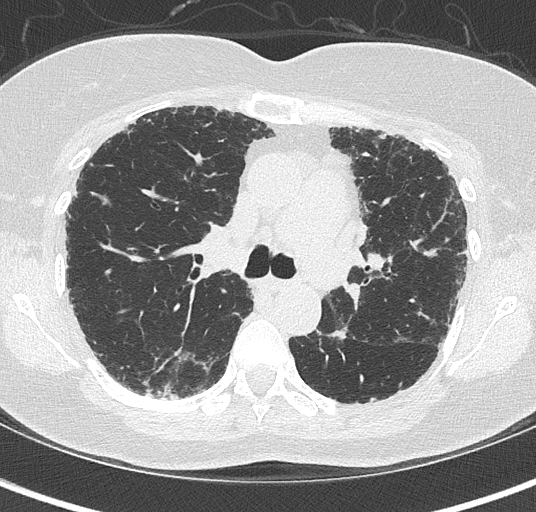
[im 205/297  mediastinal]
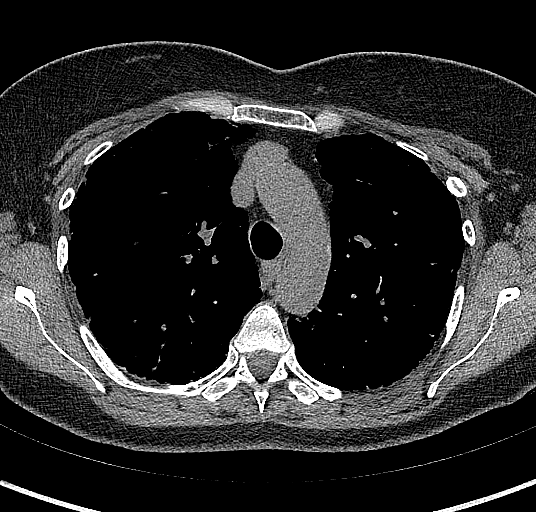
[im 205/297  lung]
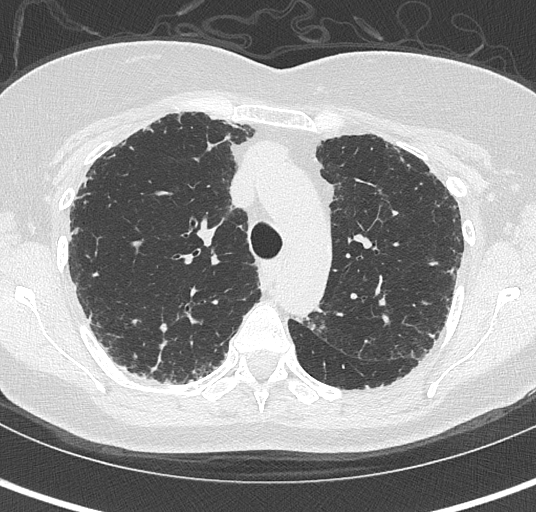
[im 228/297  lung]
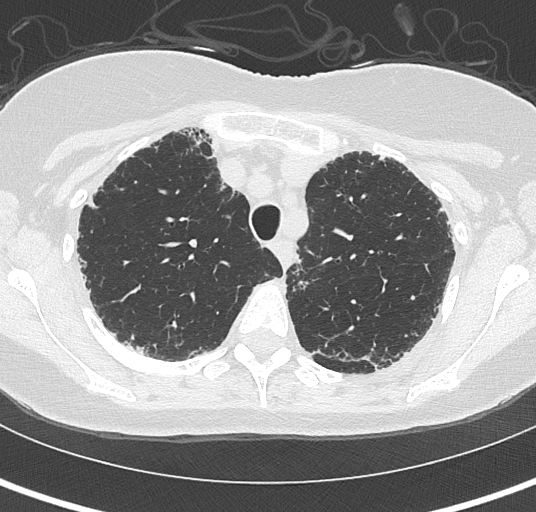
[im 251/297  lung]
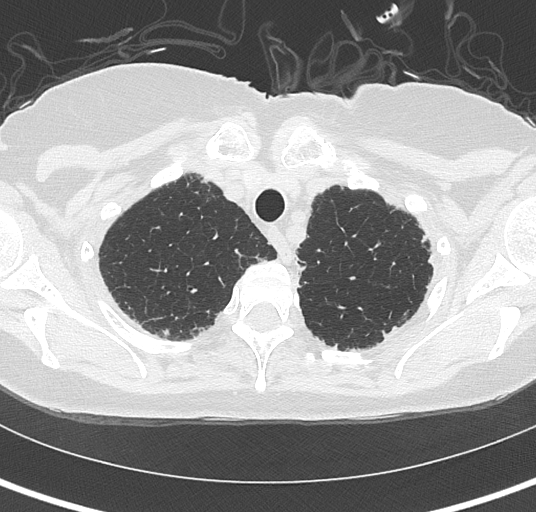
[im 274/297  lung]
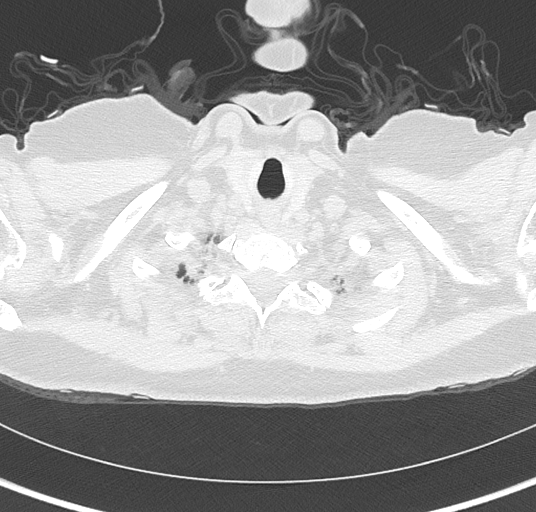

[15 of 36 positions shown; findings below may reference images not displayed]

FINDINGS: Cardiovascular: No significant vascular findings. Normal heart size.
No pericardial effusion.

Mediastinum/Nodes: No enlarged mediastinal, hilar, or axillary lymph
nodes. Small hiatal hernia. Thyroid gland, trachea, and esophagus
demonstrate no significant findings.

Lungs/Pleura: There has been no significant interval change in mild
to moderate pulmonary fibrosis in a pattern with slight apical to
basal gradient featuring irregular peripheral interstitial opacity
and areas of bronchiolectasis in the lung bases without clear
evidence of honeycombing. No significant air trapping. No pleural
effusion or pneumothorax.

Upper Abdomen: No acute abnormality. Large gallstone in the
gallbladder.

Musculoskeletal: No chest wall mass or suspicious bone lesions
identified.
IMPRESSION: 1. There has been no significant interval change in mild to moderate
pulmonary fibrosis in a pattern with slight apical to basal gradient
featuring irregular peripheral interstitial opacity and areas of
bronchiolectasis in the lung bases without clear evidence of
honeycombing. Findings are not significantly changed on examinations
dating back to 09/26/2016 and remain in a "probable UIP" pattern by
ATS pulmonary fibrosis criteria.

2.  Small hiatal hernia.

3.  Cholelithiasis.

## 2021-08-21 ENCOUNTER — Ambulatory Visit
Admission: RE | Admit: 2021-08-21 | Discharge: 2021-08-21 | Disposition: A | Discharge status: Home / Self Care | Payer: Medicare Other | Source: Ambulatory Visit | Attending: Internal Medicine | Admitting: Internal Medicine

## 2021-08-21 ENCOUNTER — Other Ambulatory Visit: Payer: Self-pay

## 2021-08-21 DIAGNOSIS — Z1231 Encounter for screening mammogram for malignant neoplasm of breast: Secondary | ICD-10-CM | POA: Diagnosis present

## 2021-10-13 ENCOUNTER — Other Ambulatory Visit
Admission: RE | Admit: 2021-10-13 | Discharge: 2021-10-13 | Disposition: A | Discharge status: Home / Self Care | Payer: Medicare Other | Source: Ambulatory Visit | Attending: Pulmonary Disease | Admitting: Pulmonary Disease

## 2021-10-13 ENCOUNTER — Ambulatory Visit (INDEPENDENT_AMBULATORY_CARE_PROVIDER_SITE_OTHER): Payer: Medicare Other | Admitting: Pulmonary Disease

## 2021-10-13 ENCOUNTER — Encounter: Payer: Self-pay | Admitting: Pulmonary Disease

## 2021-10-13 VITALS — BP 110/78 | HR 65 | Temp 98.4°F | Ht 63.0 in | Wt 135.0 lb

## 2021-10-13 DIAGNOSIS — M2550 Pain in unspecified joint: Secondary | ICD-10-CM

## 2021-10-13 DIAGNOSIS — J84112 Idiopathic pulmonary fibrosis: Secondary | ICD-10-CM | POA: Insufficient documentation

## 2021-10-13 DIAGNOSIS — R0989 Other specified symptoms and signs involving the circulatory and respiratory systems: Secondary | ICD-10-CM

## 2021-10-13 DIAGNOSIS — J8410 Pulmonary fibrosis, unspecified: Secondary | ICD-10-CM | POA: Diagnosis not present

## 2021-10-13 DIAGNOSIS — R0602 Shortness of breath: Secondary | ICD-10-CM | POA: Diagnosis present

## 2021-10-13 DIAGNOSIS — R053 Chronic cough: Secondary | ICD-10-CM

## 2021-10-13 LAB — SEDIMENTATION RATE: Sed Rate: 15 mm/hr (ref 0–30)

## 2021-10-13 LAB — CBC WITH DIFFERENTIAL/PLATELET
Abs Immature Granulocytes: 0.04 10*3/uL (ref 0.00–0.07)
Basophils Absolute: 0.1 10*3/uL (ref 0.0–0.1)
Basophils Relative: 1 %
Eosinophils Absolute: 0.6 10*3/uL — ABNORMAL HIGH (ref 0.0–0.5)
Eosinophils Relative: 6 %
HCT: 41.4 % (ref 36.0–46.0)
Hemoglobin: 13.7 g/dL (ref 12.0–15.0)
Immature Granulocytes: 0 %
Lymphocytes Relative: 24 %
Lymphs Abs: 2.4 10*3/uL (ref 0.7–4.0)
MCH: 28.5 pg (ref 26.0–34.0)
MCHC: 33.1 g/dL (ref 30.0–36.0)
MCV: 86.3 fL (ref 80.0–100.0)
Monocytes Absolute: 0.6 10*3/uL (ref 0.1–1.0)
Monocytes Relative: 6 %
Neutro Abs: 6.4 10*3/uL (ref 1.7–7.7)
Neutrophils Relative %: 63 %
Platelets: 252 10*3/uL (ref 150–400)
RBC: 4.8 MIL/uL (ref 3.87–5.11)
RDW: 12.9 % (ref 11.5–15.5)
WBC: 10.1 10*3/uL (ref 4.0–10.5)
nRBC: 0 % (ref 0.0–0.2)

## 2021-10-13 LAB — C-REACTIVE PROTEIN: CRP: 0.8 mg/dL (ref <1.0)

## 2021-10-13 NOTE — Progress Notes (Signed)
? ?Subjective:  ? ? Patient ID: Cheyenne Wong, female    DOB: 1950-12-06, 71 y.o.   MRN: [id number] ?Patient Care Team: ?Lynnea Ferrier, MD as PCP - General (Internal Medicine) ? ?Chief Complaint  ?Patient presents with  ? Consult  ? ?HPI ?Patient is a very complex 71 year old lifelong never smoker who carries a diagnosis of pulmonary fibrosis/UIP who presents for evaluation of the same as a second opinion.  She is self-referred, her primary care physician is Dr. Daniel Nones.  The patient was initially diagnosed in April 2018.  She has been followed by Dr. Ned Clines at the [facility].  She initially noted symptoms of shortness of breath and these have been stable throughout this time.  She does however now note fatigue and increase in her chronic dry cough.  She does note that Elderberry syrup does help her cough significantly.  She notes that activity will aggravate her shortness of breath and cough blood rice will resolve these 2 symptoms.  She does note significant postprandial cough.  She is on a PPI.  PPI seems to control gastroesophageal reflux symptoms but not the cough.  She does endorse issues with anxiety and "panic" which make her shortness of breath worse.  He has not had any chest pain, orthopnea or paroxysmal nocturnal dyspnea.  No lower extremity edema.  She does have significant issues with arthralgias particularly at both hands and myalgias.  Occasionally notes dysphagia.  She has been on Esbriet previously however did not tolerate the medication. ? ?She had a speech pathology evaluation for swallowing in 2021 which recommended GI evaluation due to reflux noted. ? ?Most recent PFTs from [facility] were performed in April 2022 showed an FEV1 of 1.63 L or 75% predicted, FVC of 2.02 L or 73% of predicted, FEV1/FVC of 102%, TLC of 4.10 L and RV of 2.08 L.  Findings are consistent with mild restriction.  On a CBC performed in January 2023 she had eosinophilia noted. ? ?Family history  is notable for lupus in her father otherwise as noted below. ? ?Review of Systems ?A 10 point review of systems was performed and it is as noted above otherwise negative. ? ?Past Medical History:  ?Diagnosis Date  ? GERD (gastroesophageal reflux disease)   ? Hyperparathyroidism ([facility])   ? Panic disorder   ? Right knee meniscal tear   ? UIP (usual interstitial pneumonitis) ([facility])   ? Vitamin D deficiency   ? ?Past Surgical History:  ?Procedure Laterality Date  ? COLONOSCOPY  2006  ? Dr Markham Jordan - Tubular adenoma  ? ESOPHAGOGASTRODUODENOSCOPY  9.22.06  ? Dr Elliot/Gastric polyp - hyperplastic   ? KNEE ARTHROSCOPY W/ MENISCAL REPAIR  2011  ? TUBAL LIGATION  1978  ? VAGINAL DELIVERY    ? x2  ? ?Patient Active Problem List  ? Diagnosis Date Noted  ? Need for hepatitis C screening test 06/02/2015  ? Screening for breast cancer 04/29/2015  ? Hypothyroidism 08/26/2012  ? Vitamin D deficiency 10/02/2011  ? Hyperlipidemia 09/04/2011  ? ?Family History  ?Problem Relation Age of Onset  ? Hyperlipidemia Mother   ? Heart disease Mother   ? Stroke Mother   ? Hypertension Mother   ? Depression Mother   ? ALS Father   ? Heart disease Maternal Grandmother 42  ? Cancer Maternal Grandfather 61  ?     Lung Ca  ? Heart attack Other   ? Early death Other   ?  Due to Heart attacks, multiple cousins & uncles  ? Heart disease Other   ? ?Social History  ? ?Tobacco Use  ? Smoking status: Never  ? Smokeless tobacco: Never  ?Substance Use Topics  ? Alcohol use: No  ? ?Allergies  ?Allergen Reactions  ? Tramadol Shortness Of Breath  ? Penicillins   ?  Allergy as a child  ? ?Current Meds  ?Medication Sig  ? atorvastatin (LIPITOR) 10 MG tablet Take 10 mg by mouth daily.  ? chlorpheniramine-HYDROcodone (TUSSIONEX PENNKINETIC ER) 10-8 MG/5ML Take 5 mLs by mouth.  ? cholecalciferol (VITAMIN D) 25 MCG (1000 UNIT) tablet Take 1,000 Units by mouth daily.  ? esomeprazole (NEXIUM) 40 MG capsule Take 40 mg by mouth daily at 12 noon.  ? furosemide (LASIX) 20  MG tablet Take 20 mg by mouth.  ? Naproxen Sodium 220 MG CAPS Take 220 mg by mouth daily.  ? omeprazole (PRILOSEC) 40 MG capsule Take 1 capsule (40 mg total) by mouth daily.  ? potassium chloride (KLOR-CON M) 10 MEQ tablet Take 10 mEq by mouth 2 (two) times daily.  ? [DISCONTINUED] ranitidine (ZANTAC) 150 MG tablet One at bedtime  ? ?Immunization History  ?Administered Date(s) Administered  ? Influenza Inj Mdck Quad Pf 05/08/2018, 05/25/2019, 06/16/2020, 06/02/2021  ? Influenza Split 05/04/2011, 05/19/2012  ? Influenza,inj,Quad PF,6+ Mos 03/31/2013, 06/02/2015  ? Influenza,inj,quad, With Preservative 04/15/2016  ? Influenza-Unspecified 06/03/2017  ? Janssen (J&J) SARS-COV-2 Vaccination 10/23/2019  ? PFIZER Comirnaty(Gray Top)Covid-19 Tri-Sucrose Vaccine 05/23/2020  ? Pneumococcal Conjugate-13 06/20/2018  ? Pneumococcal Polysaccharide-23 07/16/2013, 07/29/2019  ? Tdap 07/16/2008, 07/28/2018  ? ? ?   ?Objective:  ? Physical Exam ?BP 110/78 (BP Location: Left Arm, Patient Position: Sitting, Cuff Size: Normal)   Pulse 65   Temp 98.4 ?F (36.9 ?C) (Oral)   Ht 5\' 3"  (1.6 m)   Wt 135 lb (61.2 kg)   SpO2 100%   BMI 23.91 kg/m?  ?GENERAL: Well-developed, well-nourished woman, no acute distress.  Fully ambulatory, no conversational dyspnea. ?HEAD: Normocephalic, atraumatic.  ?EYES: Pupils equal, round, reactive to light.  No scleral icterus.  ?MOUTH: Oral mucosa moist.  No thrush, pharynx clear.  Angular cheilitis noted. ?NECK: Supple. No thyromegaly. Trachea midline. No JVD.  No adenopathy. ?PULMONARY: Good air entry bilaterally.  Bibasilar crackles. ?CARDIOVASCULAR: S1 and S2. Regular rate and rhythm.  No rubs, murmurs or gallops heard. ?ABDOMEN: Benign. ?MUSCULOSKELETAL: No joint deformity, no clubbing, no edema.  ?NEUROLOGIC: No overt focal deficit, no gait disturbance, speech is fluent. ?SKIN: Intact,warm,dry. ?PSYCH: Mood and behavior normal. ? ? ? ?   ?Assessment & Plan:  ? ?  ICD-10-CM   ?1. UIP (usual  interstitial pneumonitis) ([facility])  J84.112 Rheumatoid Factor  ?  ANA w/Reflex  ?  Scleroderma Diagnostic Profile  ?  C-reactive protein  ?  Sedimentation rate  ?  CANCELED: Sedimentation rate  ?  CANCELED: C-reactive protein  ? Per most recent CT chest 2021 ?Will need to reassess as above  ?  ?2. Pulmonary fibrosis ([facility])  J84.10 CT Chest High Resolution  ?  Pulmonary Function Test [facility] Only  ?  ECHOCARDIOGRAM COMPLETE  ?  Pulse oximetry, overnight  ?  Hypersensitivity pnuemonitis profile  ?  CBC w/Diff  ?  CANCELED: CBC w/Diff  ? We will need to reassess with high resolution chest CT ?Pulmonary function tests ?Connective tissue disease evaluation  ?  ?3. Polyarthralgia  M25.50 Rheumatoid Factor  ?  ANA w/Reflex  ?  Scleroderma Diagnostic Profile  ?  C-reactive protein  ?  Sedimentation rate  ?  CANCELED: Sedimentation rate  ?  CANCELED: C-reactive protein  ? This issue adds complexity to her management ?Connective tissue disease work-up  ?  ?4. Suspected pulmonary hypertension  R09.89 ECHOCARDIOGRAM COMPLETE  ? 2D echocardiogram to screen for PAH ?Patient at high risk due to pulmonary fibrosis  ?  ?5. SOB (shortness of breath)  R06.02 [facility]  ?  ECHOCARDIOGRAM COMPLETE  ?  Pulse oximetry, overnight  ?  Hypersensitivity pnuemonitis profile  ?  CBC w/Diff  ?  CANCELED: CBC w/Diff  ? Ambulatory oximetry today did not show oxygen desaturations ?Will obtain overnight oximetry  ?  ?6. Chronic cough  R05.3   ? Likely related to pulmonary fibrosis ?Patient notes relief with simple measures at home  ?  ? ?Orders Placed This Encounter  ?Procedures  ? CT Chest High Resolution  ?  Standing Status:   Future  ?  Number of Occurrences:   1  ?  Standing Expiration Date:   10/14/2022  ?  Order Specific Question:   Preferred imaging location?  ?  Answer:   [facility]  ? Rheumatoid Factor  ?  Standing Status:   Future  ?  Number of Occurrences:   1  ?  Standing Expiration Date:   10/14/2022  ? ANA  w/Reflex  ?  Standing Status:   Future  ?  Number of Occurrences:   1  ?  Standing Expiration Date:   10/14/2022  ? Scleroderma Diagnostic Profile  ?  Standing Status:   Future  ?  Number of Occurrences:   1  ?  Standing Expi

## 2021-10-13 NOTE — Patient Instructions (Addendum)
We are in a sense going to start from "square 1". ? ?We are going to get baseline studies to include a CT of the chest, breathing test, and echocardiogram (heart test) and an overnight test to tell us about your oxygen level when you sleep. ? ?Over-the-counter there are certain medications that may help you with your cough you seem to have good results with the Elderberry syrup but you can also alternate this with Delsym over-the-counter. ? ?Before bedtime I would recommend you take some Pepcid AC 20 milligrams, 1 tablet at bedtime and Chlor-Trimeton (also over-the-counter) 1 tablet at bedtime. ? ?Continue taking your Nexium in the mornings. ? ?We will see you in follow-up in 6-8 weeks time call sooner should any new problems arise. ? ?

## 2021-10-14 LAB — SCLERODERMA DIAGNOSTIC PROFILE
Anti Nuclear Antibody (ANA): NEGATIVE
Scleroderma (Scl-70) (ENA) Antibody, IgG: <0.2 AI (ref 0.0–0.9)

## 2021-10-14 LAB — RHEUMATOID FACTOR: Rheumatoid fact SerPl-aCnc: 10.8 IU/mL (ref <14.0)

## 2021-10-18 ENCOUNTER — Telehealth: Payer: Self-pay | Admitting: Pulmonary Disease

## 2021-10-18 NOTE — Telephone Encounter (Signed)
Cheyenne Saner, MD  Rosaland Lao, CMA ?She does have some elevated allergy cells in the blood sample.  All other tests with regards to lupus and rheumatoid arthritis and scleroderma were negative.  There was a test ordered a hypersensitivity pneumonitis panel which was not done by the lab and I cannot tell why.  She may need to have this repeated when she comes back.  ? ?Patient is aware of results and voiced her understanding.  ?Nothing further needed.  ? ?

## 2021-10-23 LAB — HYPERSENSITIVITY PNEUMONITIS
A. Pullulans Abs: NEGATIVE
A.Fumigatus #1 Abs: NEGATIVE
Micropolyspora faeni, IgG: NEGATIVE
Pigeon Serum Abs: NEGATIVE
Thermoact. Saccharii: NEGATIVE
Thermoactinomyces vulgaris, IgG: NEGATIVE

## 2021-10-25 ENCOUNTER — Ambulatory Visit (INDEPENDENT_AMBULATORY_CARE_PROVIDER_SITE_OTHER): Payer: Medicare Other

## 2021-10-25 DIAGNOSIS — R0602 Shortness of breath: Secondary | ICD-10-CM | POA: Diagnosis not present

## 2021-10-25 DIAGNOSIS — J8410 Pulmonary fibrosis, unspecified: Secondary | ICD-10-CM | POA: Diagnosis not present

## 2021-10-25 DIAGNOSIS — R0989 Other specified symptoms and signs involving the circulatory and respiratory systems: Secondary | ICD-10-CM

## 2021-10-26 ENCOUNTER — Ambulatory Visit
Admission: RE | Admit: 2021-10-26 | Discharge: 2021-10-26 | Disposition: A | Discharge status: Home / Self Care | Payer: Medicare Other | Source: Ambulatory Visit | Attending: Pulmonary Disease | Admitting: Pulmonary Disease

## 2021-10-26 ENCOUNTER — Other Ambulatory Visit: Payer: Self-pay

## 2021-10-26 ENCOUNTER — Telehealth: Payer: Self-pay | Admitting: *Deleted

## 2021-10-26 DIAGNOSIS — J8410 Pulmonary fibrosis, unspecified: Secondary | ICD-10-CM | POA: Insufficient documentation

## 2021-10-26 LAB — ECHOCARDIOGRAM COMPLETE
AR max vel: 2.49 cm2
AV Area VTI: 2.28 cm2
AV Area mean vel: 2.15 cm2
AV Mean grad: 3 mmHg
AV Peak grad: 5.2 mmHg
AV Vena cont: 0.6 cm
Ao pk vel: 1.14 m/s
Area-P 1/2: 3.66 cm2
Calc EF: 59.8 %
P 1/2 time: 544 msec
S' Lateral: 2.3 cm
Single Plane A2C EF: 59.7 %
Single Plane A4C EF: 61 %

## 2021-10-26 NOTE — Telephone Encounter (Signed)
Called and spoke with patient, advised of results/recommendations per Dr. Jayme Cloud.  I asked her who she would like to use for the oxygen (DME) company or would she like [place] to send it to insurance preference.  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  She chose to go with [employer].  Advised I would send the order to [employer] and they will be in contact with her directly regarding her receiving the concentrator and the supplies.  I did advise her that this is not a local DME and it may take a little while to arrive.  She verbalized understanding.  Order sent to [employer] for 2L of oxygen at HS.  Nothing further needed. ?

## 2021-10-29 ENCOUNTER — Encounter: Payer: Self-pay | Admitting: Pulmonary Disease

## 2021-10-29 DIAGNOSIS — J8410 Pulmonary fibrosis, unspecified: Secondary | ICD-10-CM

## 2021-10-30 NOTE — Telephone Encounter (Signed)
Dr. Gonzalez, please advise. Thanks 

## 2021-11-16 ENCOUNTER — Encounter: Payer: Self-pay | Admitting: Pulmonary Disease

## 2021-11-16 ENCOUNTER — Ambulatory Visit (INDEPENDENT_AMBULATORY_CARE_PROVIDER_SITE_OTHER): Payer: Medicare Other

## 2021-11-16 DIAGNOSIS — R0609 Other forms of dyspnea: Secondary | ICD-10-CM

## 2021-11-16 DIAGNOSIS — K219 Gastro-esophageal reflux disease without esophagitis: Secondary | ICD-10-CM | POA: Insufficient documentation

## 2021-11-22 ENCOUNTER — Telehealth: Payer: Self-pay | Admitting: Pulmonary Disease

## 2021-11-22 NOTE — Telephone Encounter (Signed)
Lm for [employer]. Patient prescribed 2L QHS. SMW test was normal, pt does not require oxygen during the day.  ?Spoke to patient and verified that she is inquiring about nocturnal oxygen only.  ?  ?

## 2021-11-23 NOTE — Telephone Encounter (Signed)
Lm x2 for [place] with [employer].  ?Will call once more due to nature of call.  ? ? ?Received Rx from [employer] for cont oxygen. Rx faxed back to [employer] with note that nocturnal oxygen only is needed.  ? ?

## 2021-11-24 NOTE — Telephone Encounter (Signed)
Lm x3 for [place] with [employer]. ?Will close encounter per office protocol.  ? ?

## 2021-11-27 NOTE — Telephone Encounter (Signed)
Spoke to [place] with [employer] and made her aware that patient needs nocturnal oxygen only.  ?[place] voiced her understanding.  ?Last OV note faxed to [place] as requested. ?Nothing further needed.  ? ?

## 2021-12-06 ENCOUNTER — Ambulatory Visit: Payer: Medicare Other | Attending: Pulmonary Disease

## 2021-12-06 ENCOUNTER — Other Ambulatory Visit: Payer: Self-pay | Admitting: Pulmonary Disease

## 2021-12-06 ENCOUNTER — Other Ambulatory Visit: Payer: Self-pay | Admitting: Internal Medicine

## 2021-12-06 DIAGNOSIS — R0602 Shortness of breath: Secondary | ICD-10-CM

## 2021-12-06 DIAGNOSIS — J8410 Pulmonary fibrosis, unspecified: Secondary | ICD-10-CM

## 2021-12-13 ENCOUNTER — Ambulatory Visit: Payer: Medicare Other | Attending: Pulmonary Disease

## 2021-12-13 DIAGNOSIS — R0602 Shortness of breath: Secondary | ICD-10-CM | POA: Diagnosis not present

## 2021-12-13 DIAGNOSIS — J8410 Pulmonary fibrosis, unspecified: Secondary | ICD-10-CM | POA: Diagnosis not present

## 2021-12-13 LAB — PULMONARY FUNCTION TEST ARMC ONLY
DL/VA % pred: 107 %
DL/VA: 4.49 ml/min/mmHg/L
DLCO unc % pred: 67 %
DLCO unc: 12.88 ml/min/mmHg
FEF 25-75 Post: 1.68 L/sec
FEF 25-75 Pre: 1.42 L/sec
FEF2575-%Change-Post: 17 %
FEF2575-%Pred-Post: 86 %
FEF2575-%Pred-Pre: 73 %
FEV1-%Change-Post: 6 %
FEV1-%Pred-Post: 71 %
FEV1-%Pred-Pre: 67 %
FEV1-Post: 1.6 L
FEV1-Pre: 1.5 L
FEV1FVC-%Change-Post: 7 %
FEV1FVC-%Pred-Pre: 105 %
FEV6-%Change-Post: 0 %
FEV6-%Pred-Post: 65 %
FEV6-%Pred-Pre: 65 %
FEV6-Post: 1.85 L
FEV6-Pre: 1.84 L
FEV6FVC-%Pred-Post: 104 %
FEV6FVC-%Pred-Pre: 104 %
FVC-%Change-Post: 0 %
FVC-%Pred-Post: 62 %
FVC-%Pred-Pre: 63 %
FVC-Post: 1.85 L
FVC-Pre: 1.87 L
Post FEV1/FVC ratio: 86 %
Post FEV6/FVC ratio: 100 %
Pre FEV1/FVC ratio: 80 %
Pre FEV6/FVC Ratio: 100 %
RV % pred: 28 %
RV: 0.59 L
TLC % pred: 58 %
TLC: 2.87 L

## 2021-12-13 MED ORDER — ALBUTEROL SULFATE (2.5 MG/3ML) 0.083% IN NEBU
2.5000 mg | INHALATION_SOLUTION | Freq: Once | RESPIRATORY_TRACT | Status: AC
  Administered 2021-12-13: 2.5 mg via RESPIRATORY_TRACT

## 2021-12-15 ENCOUNTER — Ambulatory Visit (INDEPENDENT_AMBULATORY_CARE_PROVIDER_SITE_OTHER): Payer: Medicare Other | Admitting: Pulmonary Disease

## 2021-12-15 ENCOUNTER — Encounter: Payer: Self-pay | Admitting: Pulmonary Disease

## 2021-12-15 ENCOUNTER — Other Ambulatory Visit
Admission: RE | Admit: 2021-12-15 | Discharge: 2021-12-15 | Disposition: A | Discharge status: Home / Self Care | Payer: Medicare Other | Attending: Pulmonary Disease | Admitting: Pulmonary Disease

## 2021-12-15 VITALS — BP 128/80 | HR 77 | Temp 97.8°F | Ht 63.0 in | Wt 132.4 lb

## 2021-12-15 DIAGNOSIS — G4734 Idiopathic sleep related nonobstructive alveolar hypoventilation: Secondary | ICD-10-CM

## 2021-12-15 DIAGNOSIS — R0602 Shortness of breath: Secondary | ICD-10-CM

## 2021-12-15 DIAGNOSIS — J8410 Pulmonary fibrosis, unspecified: Secondary | ICD-10-CM | POA: Diagnosis not present

## 2021-12-15 DIAGNOSIS — R053 Chronic cough: Secondary | ICD-10-CM

## 2021-12-15 LAB — CBC WITH DIFFERENTIAL/PLATELET
Abs Immature Granulocytes: 0.03 10*3/uL (ref 0.00–0.07)
Basophils Absolute: 0.1 10*3/uL (ref 0.0–0.1)
Basophils Relative: 1 %
Eosinophils Absolute: 0.3 10*3/uL (ref 0.0–0.5)
Eosinophils Relative: 3 %
HCT: 44.7 % (ref 36.0–46.0)
Hemoglobin: 14.8 g/dL (ref 12.0–15.0)
Immature Granulocytes: 0 %
Lymphocytes Relative: 21 %
Lymphs Abs: 2.1 10*3/uL (ref 0.7–4.0)
MCH: 28.6 pg (ref 26.0–34.0)
MCHC: 33.1 g/dL (ref 30.0–36.0)
MCV: 86.5 fL (ref 80.0–100.0)
Monocytes Absolute: 0.8 10*3/uL (ref 0.1–1.0)
Monocytes Relative: 7 %
Neutro Abs: 7 10*3/uL (ref 1.7–7.7)
Neutrophils Relative %: 68 %
Platelets: 235 10*3/uL (ref 150–400)
RBC: 5.17 MIL/uL — ABNORMAL HIGH (ref 3.87–5.11)
RDW: 12.7 % (ref 11.5–15.5)
WBC: 10.4 10*3/uL (ref 4.0–10.5)
nRBC: 0 % (ref 0.0–0.2)

## 2021-12-15 MED ORDER — TRELEGY ELLIPTA 100-62.5-25 MCG/ACT IN AEPB: 1.0000 | INHALATION_SPRAY | Freq: Every day | RESPIRATORY_TRACT | 0 refills | Status: DC

## 2021-12-15 NOTE — Patient Instructions (Signed)
We are investigating what is going on with your oxygen concentrator.  Start taking a supplement called N-acetylcysteine (NAC) 600 mg twice a day with meals.  This is a supplement that you can find at stores like [employer] or [facility] or you can get online.  This supplement acts as an anti-inflammatory  We are checking an allergy profile following up on the level of your allergy cells in the blood that were elevated.  I am giving you a trial of an inhaler to see if that controls your cough.  The inhaler is called Trelegy Ellipta.  Is 1 puff daily.  Make sure you rinse your mouth well after you use it.  Let us know how you do with it.  We will see you in follow-up in 6 to 8 weeks time call sooner should any new problems arise.

## 2021-12-15 NOTE — Progress Notes (Signed)
Subjective:    Patient ID: Cheyenne Wong, female    DOB: 06-Apr-1951, 71 y.o.   MRN: [id number] Patient Care Team: Lynnea FerrierKlein, Bert J III, MD as PCP - General (Internal Medicine)  Chief Complaint  Patient presents with   Follow-up    Sob with exertion and non prod cough.     HPI Patient is a very complex 71 year old lifelong never smoker who carries a diagnosis of pulmonary fibrosis/UIP who presents for follow-up of the same, this is a scheduled visit.  The patient was initially diagnosed in April 2018 however fibrotic changes were noted initially on CT of 2015.  She had been followed previously by Dr. Ned ClinesHerbon Fleming at [facility].  She initially noted symptoms of shortness of breath and these have been stable throughout this time.  She does however now note fatigue and increase in her chronic dry cough.  She does note that Elderberry syrup does help her cough significantly.  She notes that activity will aggravate her shortness of breath and cough but rest will resolve these 2 symptoms.  She does note significant postprandial cough.  She is on a PPI.  PPI seems to control gastroesophageal reflux symptoms but not the cough.  She does endorse issues with anxiety and "panic" which make her shortness of breath worse.  He has not had any chest pain, orthopnea or paroxysmal nocturnal dyspnea.  No lower extremity edema.  She does have significant issues with arthralgias particularly at both hands and myalgias. Occasionally notes dysphagia.  She has been on Esbriet previously however did not tolerate the medication.  She does not wish to consider antifibrotic's again.  We discussed the findings on her PFTs and that there is a mild airways reactivity component.  She has not had any fevers, chills or sweats.  No hemoptysis.  Cough is dry and without sputum production.  She was noted to have eosinophilia on prior CBCs.   Overnight oximetry reveal nocturnal oxygen desaturations O2 sats as low as 84%.   She has not received her oxygen concentrator as ordered.  We will need to investigate.  DATA 10/24/2021 overnight oximetry: Qualified for O2 with significant desaturations overnight as low as 84%. 10/26/2021 CT chest high-resolution: Slightly worsened fibrotic changes and honeycombing from prior CT of 11 January 2020, findings consistent with UIP. 12/13/2021 PFTs: FEV1 1.50 L or 67% predicted, FVC 1.87 L or 63% predicted, FEV1/FVC 76%.  Lung volumes moderately reduced, there is a small airways component.  Diffusion capacity moderately reduced.  Compared to [facility] there has been slight decline in function.  PFTs discussed with patient today during the visit.  Review of Systems A 10 point review of systems was performed and it is as noted above otherwise negative.  Patient Active Problem List   Diagnosis Date Noted   GERD (gastroesophageal reflux disease) 11/16/2021   Other dysphagia 04/01/2017   Pulmonary fibrosis (HCC) 10/15/2016   Need for hepatitis C screening test 06/02/2015   Screening for breast cancer 04/29/2015   Hypothyroidism 08/26/2012   Vitamin D deficiency 10/02/2011   Hyperlipidemia 09/04/2011   Social History   Tobacco Use   Smoking status: Never   Smokeless tobacco: Never  Substance Use Topics   Alcohol use: No   Allergies  Allergen Reactions   Tramadol Shortness Of Breath   Penicillins     Allergy as a child   Current Meds  Medication Sig   atorvastatin (LIPITOR) 10 MG tablet  Take 10 mg by mouth daily.   chlorpheniramine-HYDROcodone 10-8 MG/5ML Take 5 mLs by mouth.   cholecalciferol (VITAMIN D) 25 MCG (1000 UNIT) tablet Take 1,000 Units by mouth daily.   esomeprazole (NEXIUM) 40 MG capsule Take 40 mg by mouth daily at 12 noon.   famotidine (PEPCID) 20 MG tablet Take 20 mg by mouth 2 (two) times daily.   furosemide (LASIX) 20 MG tablet Take 20 mg by mouth.   Naproxen Sodium 220 MG CAPS Take 220 mg by mouth daily.    omeprazole (PRILOSEC) 40 MG capsule Take 1 capsule (40 mg total) by mouth daily.   potassium chloride (KLOR-CON M) 10 MEQ tablet Take 10 mEq by mouth 2 (two) times daily.   Immunization History  Administered Date(s) Administered   Influenza Inj Mdck Quad Pf 05/08/2018, 05/25/2019, 06/16/2020, 06/02/2021   Influenza Split 05/04/2011, 05/19/2012   Influenza,inj,Quad PF,6+ Mos 03/31/2013, 06/02/2015   Influenza,inj,quad, With Preservative 04/15/2016   Influenza-Unspecified 06/03/2017   Janssen (J&J) SARS-COV-2 Vaccination 10/23/2019   PFIZER Comirnaty(Gray Top)Covid-19 Tri-Sucrose Vaccine 05/23/2020   Pneumococcal Conjugate-13 06/20/2018   Pneumococcal Polysaccharide-23 07/16/2013, 07/29/2019   Tdap 07/16/2008, 07/28/2018       Objective:   Physical Exam BP 128/80 (BP Location: Left Arm, Cuff Size: Normal)   Pulse 77   Temp 97.8 F (36.6 C) (Temporal)   Ht 5\' 3"  (1.6 m)   Wt 132 lb 6.4 oz (60.1 kg)   SpO2 97%   BMI 23.45 kg/m  GENERAL: Well-developed, well-nourished woman, no acute distress.  Fully ambulatory, no conversational dyspnea.  Mild tachypnea noted. HEAD: Normocephalic, atraumatic.  EYES: Pupils equal, round, reactive to light.  No scleral icterus.  MOUTH: Oral mucosa moist.  No thrush, pharynx clear.  No angular cheilitis noted today. NECK: Supple. No thyromegaly. Trachea midline. No JVD.  No adenopathy. PULMONARY: Good air entry bilaterally.  Faint bibasilar crackles. CARDIOVASCULAR: S1 and S2. Regular rate and rhythm.  No rubs, murmurs or gallops heard. ABDOMEN: Benign. MUSCULOSKELETAL: No joint deformity, no clubbing, no edema.  NEUROLOGIC: No overt focal deficit, no gait disturbance, speech is fluent. SKIN: Intact,warm,dry. PSYCH: Mood and behavior normal.       Assessment & Plan:     ICD-10-CM   1. Pulmonary fibrosis (HCC)  J84.10    Encouraged her to consider alternative antifibrotic Mild decline in function from prior    2. Chronic cough  R05.3 CBC  w/Diff    Allergen Panel (27) + IGE   Trial of Trelegy Ellipta Query element of airways reactivity    3. SOB (shortness of breath)  R06.02    Related to UIP/IPF Consider pulmonary rehab    4. Nocturnal hypoxemia  G47.34    Continue oxygen at 2 L/min nocturnally     Orders Placed This Encounter  Procedures   CBC w/Diff    Standing Status:   Future    Number of Occurrences:   1    Standing Expiration Date:   12/16/2022   Allergen Panel (27) + IGE    Standing Status:   Future    Number of Occurrences:   1    Standing Expiration Date:   12/16/2022   Meds ordered this encounter  Medications   Fluticasone-Umeclidin-Vilant (TRELEGY ELLIPTA) 100-62.5-25 MCG/ACT AEPB    Sig: Inhale 1 puff into the lungs daily.    Dispense:  60 each    Refill:  0    Order Specific Question:   Lot Number?    Answer:   [id number]  Order Specific Question:   Expiration Date?    Answer:   05/17/2023    Order Specific Question:   Manufacturer?    Answer:   [employer] [[id number]]    Order Specific Question:   Quantity    Answer:   2   We will give patient a trial of Trelegy Ellipta as she does have some mild airways reactivity noted on PFTs.  See if this will help with her cough.  We will investigate why her oxygen concentrator has not been delivered.  We will see her in follow-up in 6 to 8 weeks time she is to contact us prior to that time should any new difficulties arise.   Gailen Shelter, MD Advanced Bronchoscopy PCCM [facility]    *This note was dictated using voice recognition software/Dragon.  Despite best efforts to proofread, errors can occur which can change the meaning. Any transcriptional errors that result from this process are unintentional and may not be fully corrected at the time of dictation.

## 2021-12-18 LAB — ALLERGEN PANEL (27) + IGE
Alternaria Alternata IgE: <0.1 kU/L
Aspergillus Fumigatus IgE: <0.1 kU/L
Bahia Grass IgE: <0.1 kU/L
Bermuda Grass IgE: <0.1 kU/L
Cat Dander IgE: <0.1 kU/L
Cedar, Mountain IgE: <0.1 kU/L
Cladosporium Herbarum IgE: <0.1 kU/L
Cocklebur IgE: <0.1 kU/L
Cockroach, American IgE: <0.1 kU/L
Common Silver Birch IgE: <0.1 kU/L
D Farinae IgE: <0.1 kU/L
D Pteronyssinus IgE: <0.1 kU/L
Dog Dander IgE: <0.1 kU/L
Elm, American IgE: <0.1 kU/L
Hickory, White IgE: <0.1 kU/L
IgE (Immunoglobulin E), Serum: 6 IU/mL (ref 6–495)
Johnson Grass IgE: <0.1 kU/L
Kentucky Bluegrass IgE: <0.1 kU/L
Maple/Box Elder IgE: <0.1 kU/L
Mucor Racemosus IgE: <0.1 kU/L
Oak, White IgE: <0.1 kU/L
Penicillium Chrysogen IgE: <0.1 kU/L
Pigweed, Rough IgE: <0.1 kU/L
Plantain, English IgE: <0.1 kU/L
Ragweed, Short IgE: <0.1 kU/L
Setomelanomma Rostrat: <0.1 kU/L
Timothy Grass IgE: <0.1 kU/L
White Mulberry IgE: <0.1 kU/L

## 2021-12-25 ENCOUNTER — Encounter: Payer: Self-pay | Admitting: Pulmonary Disease

## 2021-12-25 MED ORDER — SPIRIVA RESPIMAT 2.5 MCG/ACT IN AERS: 2.0000 | INHALATION_SPRAY | Freq: Every day | RESPIRATORY_TRACT | 0 refills | Status: DC

## 2021-12-25 NOTE — Telephone Encounter (Signed)
I do not tolerate meds well. The trelegy makes my heart race, gives me blisters on my nose, and not able to sleep due to energy surge. However,  the results of inhaler really made a difference in my breathing. Do I keep taking on different schedule, as I can not tolerate daily. Or, do you want to try something else ?   Dr. Jayme Cloud, please advise. Thanks

## 2021-12-25 NOTE — Telephone Encounter (Signed)
I do not think that the blisters in her nose are from the Trelegy.  We could try Spiriva Respimat 2.5 mcg 2 puffs daily, we can give her samples to try.  We would try this first before adding any other medication to it.

## 2022-02-26 ENCOUNTER — Ambulatory Visit (INDEPENDENT_AMBULATORY_CARE_PROVIDER_SITE_OTHER): Payer: Medicare Other | Admitting: Pulmonary Disease

## 2022-02-26 ENCOUNTER — Encounter: Payer: Self-pay | Admitting: Pulmonary Disease

## 2022-02-26 ENCOUNTER — Other Ambulatory Visit
Admission: RE | Admit: 2022-02-26 | Discharge: 2022-02-26 | Disposition: A | Discharge status: Home / Self Care | Payer: Medicare Other | Attending: Pulmonary Disease | Admitting: Pulmonary Disease

## 2022-02-26 ENCOUNTER — Telehealth: Payer: Self-pay

## 2022-02-26 VITALS — BP 142/84 | HR 65 | Temp 97.8°F | Ht 63.0 in | Wt 133.0 lb

## 2022-02-26 DIAGNOSIS — R053 Chronic cough: Secondary | ICD-10-CM | POA: Insufficient documentation

## 2022-02-26 DIAGNOSIS — Z5181 Encounter for therapeutic drug level monitoring: Secondary | ICD-10-CM

## 2022-02-26 DIAGNOSIS — R0602 Shortness of breath: Secondary | ICD-10-CM

## 2022-02-26 DIAGNOSIS — J84112 Idiopathic pulmonary fibrosis: Secondary | ICD-10-CM

## 2022-02-26 DIAGNOSIS — G4734 Idiopathic sleep related nonobstructive alveolar hypoventilation: Secondary | ICD-10-CM | POA: Insufficient documentation

## 2022-02-26 DIAGNOSIS — J449 Chronic obstructive pulmonary disease, unspecified: Secondary | ICD-10-CM

## 2022-02-26 LAB — CBC WITH DIFFERENTIAL/PLATELET
Abs Immature Granulocytes: 0.01 10*3/uL (ref 0.00–0.07)
Basophils Absolute: 0.1 10*3/uL (ref 0.0–0.1)
Basophils Relative: 1 %
Eosinophils Absolute: 0.5 10*3/uL (ref 0.0–0.5)
Eosinophils Relative: 7 %
HCT: 39.7 % (ref 36.0–46.0)
Hemoglobin: 13.2 g/dL (ref 12.0–15.0)
Immature Granulocytes: 0 %
Lymphocytes Relative: 22 %
Lymphs Abs: 1.7 10*3/uL (ref 0.7–4.0)
MCH: 28.6 pg (ref 26.0–34.0)
MCHC: 33.2 g/dL (ref 30.0–36.0)
MCV: 86.1 fL (ref 80.0–100.0)
Monocytes Absolute: 0.5 10*3/uL (ref 0.1–1.0)
Monocytes Relative: 7 %
Neutro Abs: 4.8 10*3/uL (ref 1.7–7.7)
Neutrophils Relative %: 63 %
Platelets: 216 10*3/uL (ref 150–400)
RBC: 4.61 MIL/uL (ref 3.87–5.11)
RDW: 13.2 % (ref 11.5–15.5)
WBC: 7.6 10*3/uL (ref 4.0–10.5)
nRBC: 0 % (ref 0.0–0.2)

## 2022-02-26 LAB — HEPATIC FUNCTION PANEL
ALT: 19 U/L (ref 0–44)
AST: 28 U/L (ref 15–41)
Albumin: 4 g/dL (ref 3.5–5.0)
Alkaline Phosphatase: 61 U/L (ref 38–126)
Bilirubin, Direct: <0.1 mg/dL (ref 0.0–0.2)
Total Bilirubin: 0.6 mg/dL (ref 0.3–1.2)
Total Protein: 6.6 g/dL (ref 6.5–8.1)

## 2022-02-26 MED ORDER — BUDESONIDE 0.25 MG/2ML IN SUSP: 0.2500 mg | Freq: Two times a day (BID) | RESPIRATORY_TRACT | 11 refills | Status: DC

## 2022-02-26 NOTE — Telephone Encounter (Signed)
Received New start paperwork for OFEV. Will update as we work through the benefits process.  Pt does not appear to have any prescription coverage at this time. Per notes pt was previously on Esbriet, however therapy was d/c'd due to "cost and perceived side effects", however after reviewing previous OV encounters I'm unable to locate any noted adverse reactions (or that she ever actually started the medication).   Will need to reach out to pt to discuss this as well as obtain income information (it was not addressed while pt was completing paperwork) and coordinate acquisition of financial documentation.

## 2022-02-26 NOTE — Progress Notes (Signed)
Subjective:    Patient ID: Cheyenne Wong, female    DOB: 1951/07/11, 71 y.o.   MRN: [id number] Patient Care Team: Lynnea Ferrier, MD as PCP - General (Internal Medicine)  Chief Complaint  Patient presents with   Follow-up    Breathing is unchanged since the last visit. She was unable to tolerate trelegy "made me climb the walls". She never started [place] due to fear of side effects.    HPI Cheyenne Wong is a 71 year old lifelong never smoker with UIP/pulmonary fibrosis who presents for follow-up.  This is a scheduled visit.  Last visit here was on 15 December 2021, at that time she had issues with not having received her oxygen concentrator which had been ordered for nocturnal hypoxemia.  She now has obtained a device and she is using oxygen at 2 L/min consistently and notes improvement in her sleep quality and symptoms doing today.  Continues to have issues with a dry cough.  We tried Trelegy Ellipta also provided for her at their last visit.  She did well with regards to cough however, she did not tolerate the inhaler because it made her too jittery.  We provided her then samples with Spiriva however she did not try the medication due to fear for side effects.  Her reflux symptoms are controlled with PPI.  As noted previously her CT and PFTs had shown mild decline from prior obtained at [facility]  Eleshia was also offered the opportunity to participate in a research program for control of cough in patients with pulmonary fibrosis however she declined this.  We discussed that her options are limited with regards to progression of disease she is now willing to try Ofev, previously she had tried Esbriet but because of cost and perceived side effects she discontinued the medication.  She does not have medication coverage with her Medicare supplement.  He has not had any fevers, chills or sweats.  No hemoptysis.  No weight loss or anorexia.  Other than her persistent cough and shortness of breath,  which has not changed in character, she feels that she is at baseline and feels overall well.   DATA 10/24/2021 overnight oximetry: Qualified for O2 with significant desaturations overnight as low as 84%. 10/26/2021 CT chest high-resolution: Slightly worsened fibrotic changes and honeycombing from prior CT of 11 January 2020, findings consistent with UIP. 12/13/2021 PFTs: FEV1 1.50 L or 67% predicted, FVC 1.87 L or 63% predicted, FEV1/FVC 76%.  Lung volumes moderately reduced, there is a small airways component.  Diffusion capacity moderately reduced.  Compared to PFTs performed at [facility] in April 2022 there has been slight decline in function.  Review of Systems A 10 point review of systems was performed and it is as noted above otherwise negative.  Patient Active Problem List   Diagnosis Date Noted   GERD (gastroesophageal reflux disease) 11/16/2021   Prediabetes 07/29/2019   Other dysphagia 04/01/2017   Pulmonary fibrosis (HCC) 10/15/2016   Need for hepatitis C screening test 06/02/2015   Screening for breast cancer 04/29/2015   Hypothyroidism 08/26/2012   Vitamin D deficiency 10/02/2011   Hyperlipidemia 09/04/2011   Social History   Tobacco Use   Smoking status: Never   Smokeless tobacco: Never  Substance Use Topics   Alcohol use: No   Allergies  Allergen Reactions   Tramadol Shortness Of Breath   Penicillins     Allergy as a child   Current Meds  Medication Sig   atorvastatin (LIPITOR)  10 MG tablet Take 10 mg by mouth daily.   budesonide (PULMICORT) 0.25 MG/2ML nebulizer solution Take 2 mLs (0.25 mg total) by nebulization 2 (two) times daily.   chlorpheniramine-HYDROcodone 10-8 MG/5ML Take 5 mLs by mouth.   cholecalciferol (VITAMIN D) 25 MCG (1000 UNIT) tablet Take 1,000 Units by mouth daily.   esomeprazole (NEXIUM) 40 MG capsule Take 40 mg by mouth daily at 12 noon.   famotidine (PEPCID) 20 MG tablet Take 20 mg by mouth 2 (two) times daily.   ibuprofen (ADVIL)  200 MG tablet Take 200 mg by mouth every 6 (six) hours as needed.   omeprazole (PRILOSEC) 40 MG capsule Take 1 capsule (40 mg total) by mouth daily.   potassium chloride (KLOR-CON M) 10 MEQ tablet Take 10 mEq by mouth 2 (two) times daily.   Immunization History  Administered Date(s) Administered   Influenza Inj Mdck Quad Pf 05/08/2018, 05/25/2019, 06/16/2020, 06/02/2021   Influenza Split 05/04/2011, 05/19/2012   Influenza,inj,Quad PF,6+ Mos 03/31/2013, 06/02/2015   Influenza,inj,quad, With Preservative 04/15/2016   Influenza-Unspecified 06/03/2017   Janssen (J&J) SARS-COV-2 Vaccination 10/23/2019   PFIZER Comirnaty(Gray Top)Covid-19 Tri-Sucrose Vaccine 05/23/2020   Pneumococcal Conjugate-13 06/20/2018   Pneumococcal Polysaccharide-23 07/16/2013, 07/29/2019   Tdap 07/16/2008, 07/28/2018       Objective:   Physical Exam BP (!) 142/84 (BP Location: Left Arm, Cuff Size: Normal)   Pulse 65   Temp 97.8 F (36.6 C) (Oral)   Ht 5\' 3"  (1.6 m)   Wt 133 lb (60.3 kg)   SpO2 100% Comment: on RA  BMI 23.56 kg/m  GENERAL: Well-developed, well-nourished woman, no acute distress.  Fully ambulatory, no conversational dyspnea.  Mild tachypnea noted. HEAD: Normocephalic, atraumatic.  EYES: Pupils equal, round, reactive to light.  No scleral icterus.  MOUTH: Oral mucosa moist.  No thrush, pharynx clear.  No angular cheilitis noted today. NECK: Supple. No thyromegaly. Trachea midline. No JVD.  No adenopathy. PULMONARY: Good air entry bilaterally.  Faint bibasilar crackles. CARDIOVASCULAR: S1 and S2. Regular rate and rhythm.  No rubs, murmurs or gallops heard. ABDOMEN: Benign. MUSCULOSKELETAL: No joint deformity, no clubbing, no edema.  NEUROLOGIC: No overt focal deficit, no gait disturbance, speech is fluent. SKIN: Intact,warm,dry. PSYCH: Mood and behavior normal.    Assessment & Plan:     ICD-10-CM   1. UIP (usual interstitial pneumonitis) (HCC)  J84.112 Ambulatory Referral for DME    CBC  w/Diff    Hepatic function panel   Trial of Ofev Initiated paperwork Baseline CBC/hepatic function panel    2. Chronic cough  R05.3 Ambulatory Referral for DME    CBC w/Diff    Hepatic function panel   Trial of Pulmicort via nebulizer 0.25 twice daily Does have part B may be able to obtain this through DME    3. SOB (shortness of breath)  R06.02 Ambulatory Referral for DME    CBC w/Diff    Hepatic function panel   Consider pulmonary rehab    4. Nocturnal hypoxemia  G47.34 Ambulatory Referral for DME    CBC w/Diff    Hepatic function panel   Continue oxygen at 2 L/min nocturnally Patient compliant, notes benefit from therapy    5. Medication monitoring encounter  Z51.81    Baseline CBC and hepatic function panel Prior to Ofev     Orders Placed This Encounter  Procedures   CBC w/Diff    Standing Status:   Future    Number of Occurrences:   1    Standing  Expiration Date:   02/27/2023   Hepatic function panel    Standing Status:   Future    Number of Occurrences:   1    Standing Expiration Date:   02/27/2023   Ambulatory Referral for DME    Referral Priority:   Routine    Referral Type:   Durable Medical Equipment Purchase    Number of Visits Requested:   1   Meds ordered this encounter  Medications   budesonide (PULMICORT) 0.25 MG/2ML nebulizer solution    Sig: Take 2 mLs (0.25 mg total) by nebulization 2 (two) times daily.    Dispense:  120 mL    Refill:  11    Dx J84.10   We will initiate paperwork for Ofev.  We will start patient on a low-dose of 100 mg twice a day to see if she tolerates this well.  Will obtain baseline CBC and hepatic function panel to monitor this throughout the therapy.  Trial of budesonide to see if this helps with her cough.  We will see the patient in follow-up in 6 to 8 weeks time she is to contact us prior to that time should any new difficulties arise.  Gailen Shelter, MD Advanced Bronchoscopy PCCM [place]  [facility]    *This note was dictated using voice recognition software/Dragon.  Despite best efforts to proofread, errors can occur which can change the meaning. Any transcriptional errors that result from this process are unintentional and may not be fully corrected at the time of dictation.

## 2022-02-26 NOTE — Patient Instructions (Signed)
We are going to initiate the work-up and paperwork for a medication called OFEV that helps with the scarring in your lungs.  We have placed prescription in for a nebulizer machine and an anti-inflammatory that you can use via nebulizer twice a day.  Make sure you rinse your mouth well after you use it.  We will draw some blood work to make sure everything is good for initiating OFEV.  We will see you in follow-up in 6 to 8 weeks time call sooner should any new problems arise.

## 2022-02-28 NOTE — Telephone Encounter (Signed)
Reached out and spoke to pt, missing information on PAP application has been completed. Explained that income documentation will be required prior to submission and pt verbalized understanding, electing to mail financial docs directly to the [facility].   Inquired into pt's history with Esbriet and she confirmed for me that application for patient assistance "fell through the cracks" and she was never actually started on therapy. I explained to her that if the Ofev did not work out for whatever reason, we would make sure that she would be able to give Esbriet a fair trial this time around. Pt expressed relief and thanks.  Will await reception of financial docs, PAP application placed in the "Awaiting Response" folder.

## 2022-03-02 ENCOUNTER — Encounter: Payer: Self-pay | Admitting: Pulmonary Disease

## 2022-03-02 NOTE — Telephone Encounter (Signed)
Dr. Gonzalez, please advise. Thanks 

## 2022-03-02 NOTE — Telephone Encounter (Signed)
Since the 2 medications are different it is okay to start the Spiriva we will start the Ofev once this is approved.

## 2022-03-07 ENCOUNTER — Other Ambulatory Visit (HOSPITAL_COMMUNITY): Payer: Self-pay

## 2022-03-07 NOTE — Telephone Encounter (Signed)
Submitted Patient Assistance Application to [employer] for OFEV along with provider portion, PA and income documents. Will update patient when we receive a response.  Fax# [phone] Phone# [phone] 

## 2022-03-07 NOTE — Telephone Encounter (Signed)
Patient mailed in documents, placed in pharmacy box upfront.

## 2022-03-09 ENCOUNTER — Telehealth: Payer: Self-pay | Admitting: Pulmonary Disease

## 2022-03-09 NOTE — Telephone Encounter (Signed)
Spoke to Terri with [employer]. She stated that theu received neb order, however patient does not have qualifying dx. Camelia Eng is asking if patient has hx of bronchitis or COPD. Codes from J41.0-J70.9 qualify.    Dr. Jayme Cloud, please advise. Thanks

## 2022-03-09 NOTE — Telephone Encounter (Signed)
Received a fax from [facility] regarding an approval for OFEV patient assistance from 03/08/22 to 03/08/23.   Phone number: [phone]  ATC patient to review approval. Unable to reach. Left VM requesting return call  Chesley Mires, PharmD, MPH, BCPS, CPP Clinical Pharmacist (Rheumatology and Pulmonology)

## 2022-03-09 NOTE — Telephone Encounter (Signed)
Can use COPD as qualifier.

## 2022-03-09 NOTE — Addendum Note (Signed)
Addended by: Lajoyce Lauber A on: 03/09/2022 02:23 PM   Modules accepted: Orders

## 2022-03-09 NOTE — Telephone Encounter (Signed)
Cheyenne Wong with [employer] and relayed below message.  Order has been sent with updated dx code. Nothing further needed.

## 2022-03-12 IMAGING — CT CT CHEST HIGH RESOLUTION W/O CM
2 of 7 series · 15 of 36 positions shown, 18 images · non-contrast
Comparison: CT chest, 12/16/2018, 07/02/2017, 09/26/2016, CT
abdomen pelvis, 03/10/2014

CLINICAL DATA: Interstitial lung disease, shortness of breath,
persistent dry cough

EXAM:
CT CHEST WITHOUT CONTRAST
TECHNIQUE: Multidetector CT imaging of the chest was performed following the
standard protocol without intravenous contrast. High resolution
imaging of the lungs, as well as inspiratory and expiratory imaging,
was performed.

[Series 5: thorax 2.00 cor · coronal · 0.54mm/px · 3 of 137 slices shown]
[im 28/137  lung]
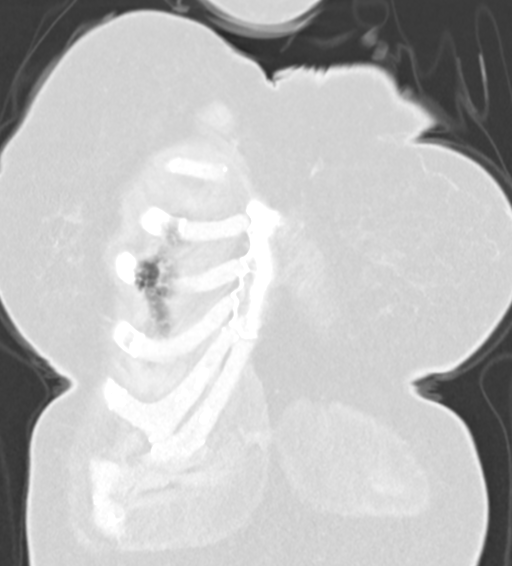
[im 55/137  lung]
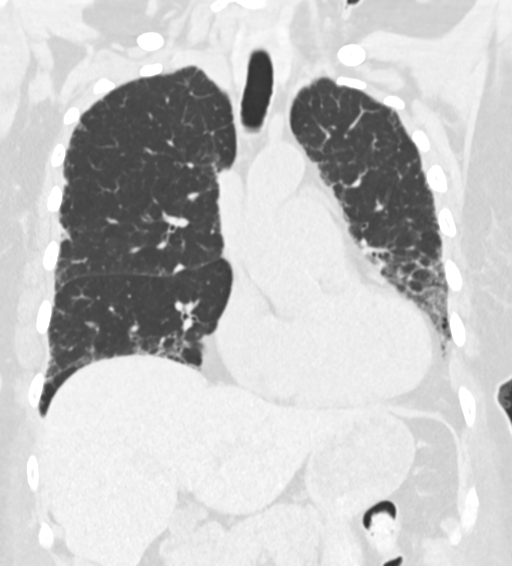
[im 82/137  lung]
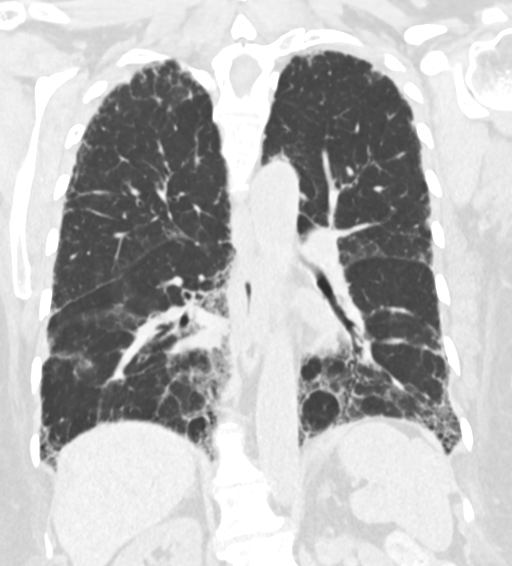

[Series 12: high res (id) thorax 1.00 ax · axial · 0.54mm/px · z∈[-1148,-892]mm · 12 of 304 slices shown, 15 images]
[im 24/304  mediastinal]
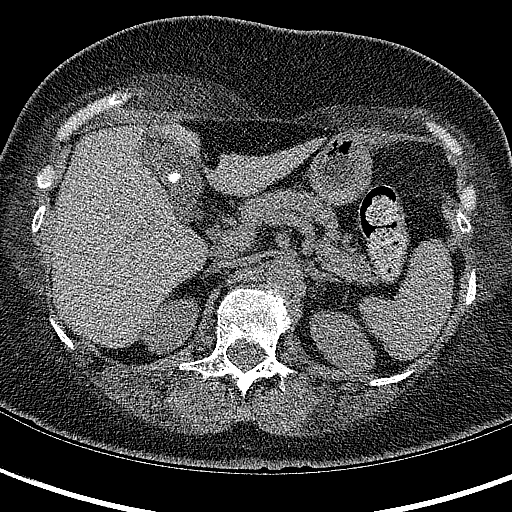
[im 24/304  lung]
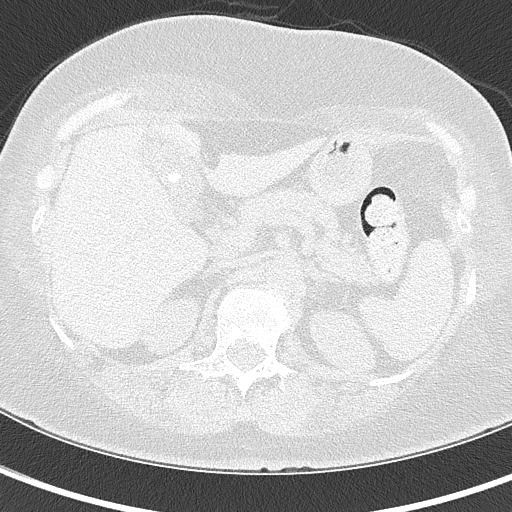
[im 47/304  lung]
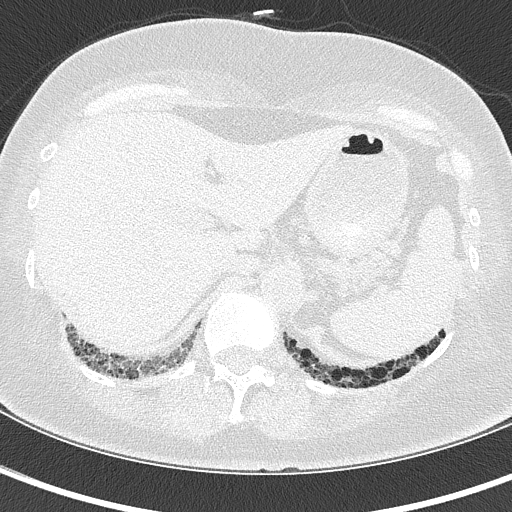
[im 70/304  lung]
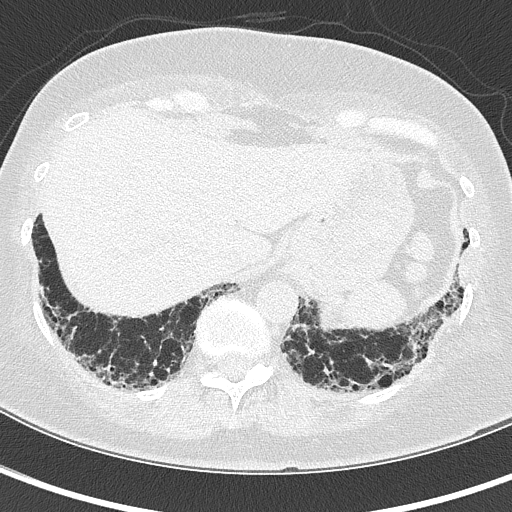
[im 94/304  lung]
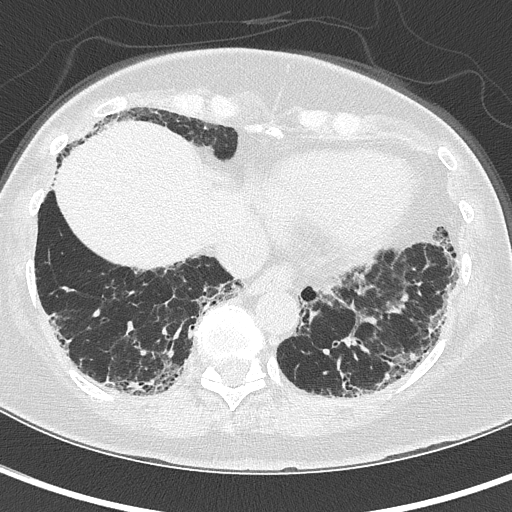
[im 117/304  mediastinal]
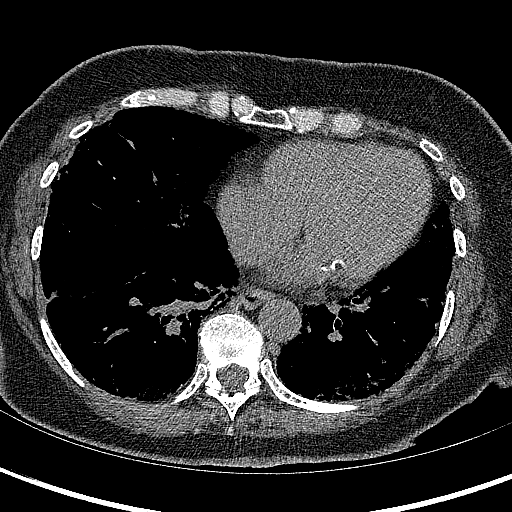
[im 117/304  lung]
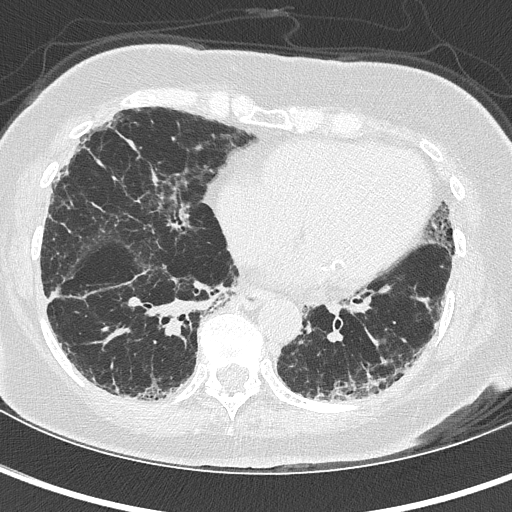
[im 140/304  lung]
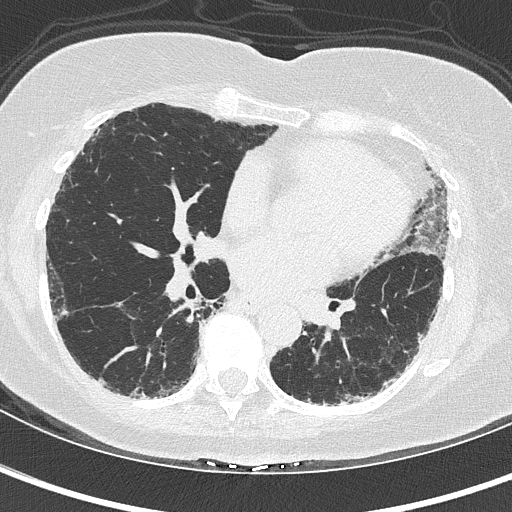
[im 164/304  lung]
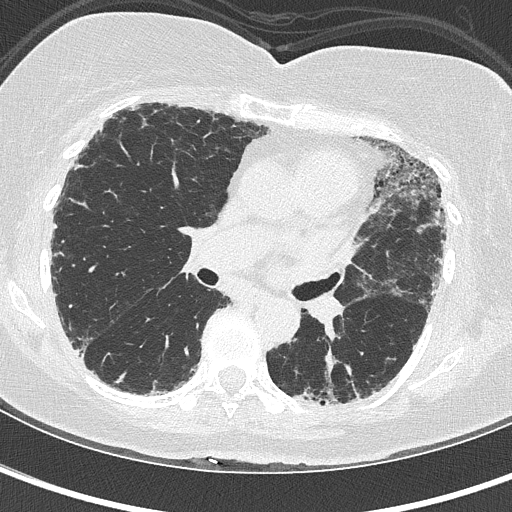
[im 187/304  lung]
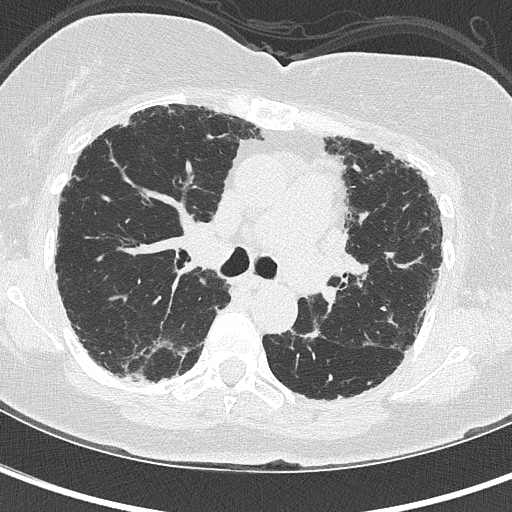
[im 210/304  mediastinal]
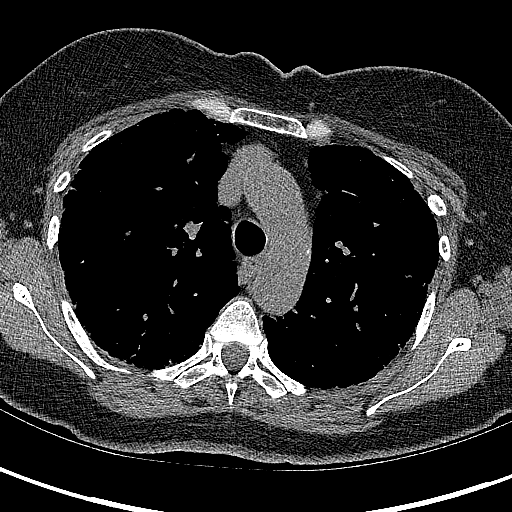
[im 210/304  lung]
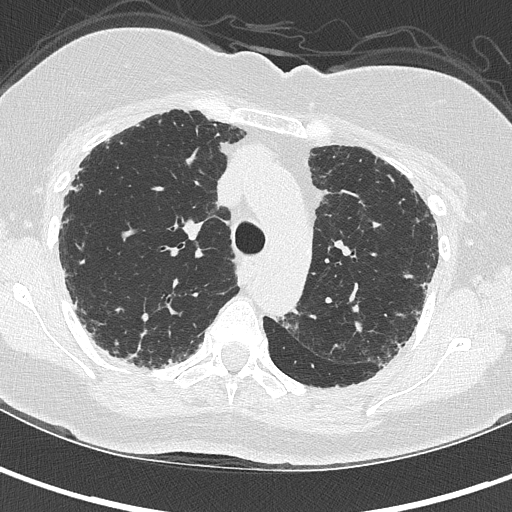
[im 234/304  lung]
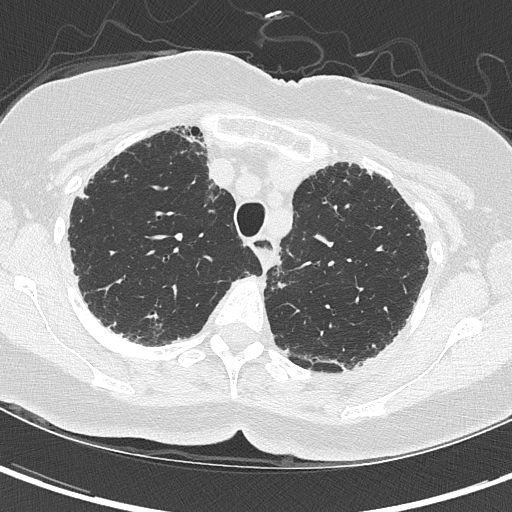
[im 257/304  lung]
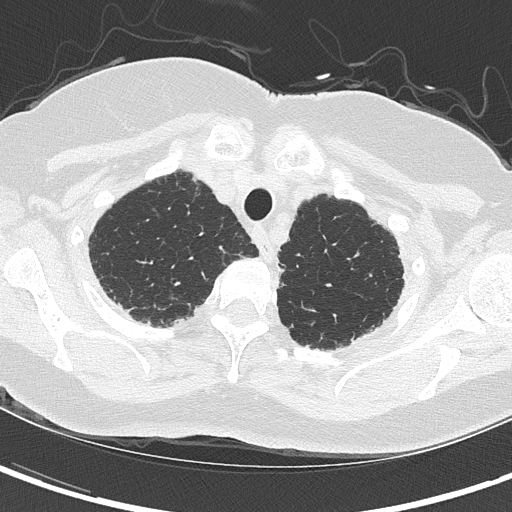
[im 280/304  lung]
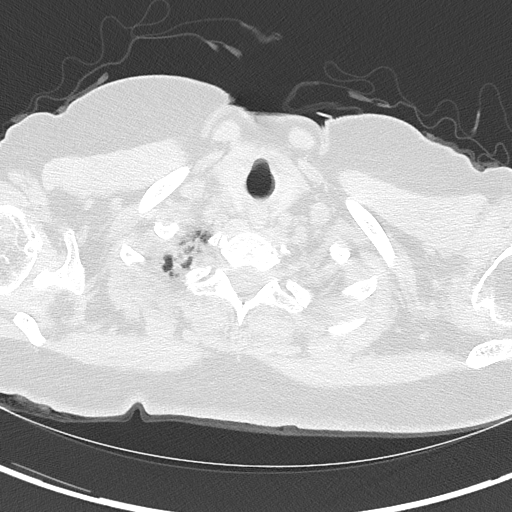

[15 of 36 positions shown; findings below may reference images not displayed]

FINDINGS: Cardiovascular: No significant vascular findings. Normal heart size.
No pericardial effusion.

Mediastinum/Nodes: No enlarged mediastinal, hilar, or axillary lymph
nodes. Small hiatal hernia. Thyroid gland, trachea, and esophagus
demonstrate no significant findings.

Lungs/Pleura: There is a redemonstrated pattern of mild to moderate
pulmonary fibrosis with apical to basal gradient, featuring
irregular peripheral interstitial opacity, mild, predominantly
tubular bronchiectasis and peripheral bronchiolectasis at the lung
bases without clear evidence of honeycombing. Findings are not
perceptible changed compared to prior examination dated 12/16/2018
and are at most minimally worsened compared to prior examination
stating back to 09/26/2016. There are however clearly worsened in
the included lung bases on prior CT of the abdomen and pelvis dated
03/10/2014. No pleural effusion or pneumothorax.

Upper Abdomen: No acute abnormality.  Gallstone in the gallbladder.

Musculoskeletal: No chest wall mass or suspicious bone lesions
identified.
IMPRESSION: 1. There is a redemonstrated pattern of mild to moderate pulmonary
fibrosis with apical to basal gradient, featuring irregular
peripheral interstitial opacity, mild, predominantly tubular
bronchiectasis and peripheral bronchiolectasis at the lung bases
without clear evidence of honeycombing. Findings are not perceptibly
changed compared to prior examination dated 12/16/2018 and are at
most minimally worsened compared to prior examination stating back
to 09/26/2016. There are however clearly worsened in the included
lung bases on prior CT of the abdomen and pelvis dated 03/10/2014,
which was not available for comparison at the time of prior CT dated
12/16/2018. Findings are again consistent with a "probable UIP"
pattern of fibrosis by ATS pulmonary fibrosis criteria, UIP strongly
favored given worsening over time.
2. Small hiatal hernia.
3. Cholelithiasis.

## 2022-03-14 ENCOUNTER — Telehealth: Payer: Self-pay

## 2022-03-14 DIAGNOSIS — Z5181 Encounter for therapeutic drug level monitoring: Secondary | ICD-10-CM

## 2022-03-14 DIAGNOSIS — J84112 Idiopathic pulmonary fibrosis: Secondary | ICD-10-CM

## 2022-03-14 MED ORDER — OFEV 100 MG PO CAPS: 100.0000 mg | ORAL_CAPSULE | Freq: Two times a day (BID) | ORAL | 3 refills | Status: DC

## 2022-03-14 NOTE — Telephone Encounter (Signed)
Subjective:  Patient presents today to [facility] to see pharmacy team for Ofev new start counseling.   Patient was last seen and referred by Vernard Gambles on 02/26/22. Pertinent past medical history includes pulmonary fibrosis, GERD,and HLD.   History of CAD: No History of MI: No Current anticoagulant use: No History of HTN: No  History of elevated LFTs: No History of diarrhea, nausea, vomiting: Yes  Objective: Allergies  Allergen Reactions   Tramadol Shortness Of Breath   Penicillins     Allergy as a child    Outpatient Encounter Medications as of 03/14/2022  Medication Sig   atorvastatin (LIPITOR) 10 MG tablet Take 10 mg by mouth daily.   budesonide (PULMICORT) 0.25 MG/2ML nebulizer solution Take 2 mLs (0.25 mg total) by nebulization 2 (two) times daily.   chlorpheniramine-HYDROcodone 10-8 MG/5ML Take 5 mLs by mouth.   cholecalciferol (VITAMIN D) 25 MCG (1000 UNIT) tablet Take 1,000 Units by mouth daily.   esomeprazole (NEXIUM) 40 MG capsule Take 40 mg by mouth daily at 12 noon.   famotidine (PEPCID) 20 MG tablet Take 20 mg by mouth 2 (two) times daily.   ibuprofen (ADVIL) 200 MG tablet Take 200 mg by mouth every 6 (six) hours as needed.   omeprazole (PRILOSEC) 40 MG capsule Take 1 capsule (40 mg total) by mouth daily.   potassium chloride (KLOR-CON M) 10 MEQ tablet Take 10 mEq by mouth 2 (two) times daily.   No facility-administered encounter medications on file as of 03/14/2022.     Immunization History  Administered Date(s) Administered   Influenza Inj Mdck Quad Pf 05/08/2018, 05/25/2019, 06/16/2020, 06/02/2021   Influenza Split 05/04/2011, 05/19/2012   Influenza,inj,Quad PF,6+ Mos 03/31/2013, 06/02/2015   Influenza,inj,quad, With Preservative 04/15/2016   Influenza-Unspecified 06/03/2017   Janssen (J&J) SARS-COV-2 Vaccination 10/23/2019   PFIZER Comirnaty(Gray Top)Covid-19 Tri-Sucrose Vaccine 05/23/2020   Pneumococcal Conjugate-13 06/20/2018   Pneumococcal  Polysaccharide-23 07/16/2013, 07/29/2019   Tdap 07/16/2008, 07/28/2018      PFT's TLC  Date Value Ref Range Status  12/13/2021 2.87 L Final      CMP     Component Value Date/Time   NA 141 08/04/2019 1057   K 4.0 08/04/2019 1057   CL 101 08/04/2019 1057   CO2 28 08/04/2019 1057   GLUCOSE 100 (H) 08/04/2019 1057   BUN 16 08/04/2019 1057   CREATININE 0.89 08/04/2019 1057   CALCIUM 10.3 08/04/2019 1057   PROT 6.6 02/26/2022 0919   ALBUMIN 4.0 02/26/2022 0919   AST 28 02/26/2022 0919   ALT 19 02/26/2022 0919   ALKPHOS 61 02/26/2022 0919   BILITOT 0.6 02/26/2022 0919   GFRNONAA >60 08/04/2019 1057   GFRAA >60 08/04/2019 1057      CBC    Component Value Date/Time   WBC 7.6 02/26/2022 0919   RBC 4.61 02/26/2022 0919   HGB 13.2 02/26/2022 0919   HCT 39.7 02/26/2022 0919   PLT 216 02/26/2022 0919   MCV 86.1 02/26/2022 0919   MCH 28.6 02/26/2022 0919   MCHC 33.2 02/26/2022 0919   RDW 13.2 02/26/2022 0919   LYMPHSABS 1.7 02/26/2022 0919   MONOABS 0.5 02/26/2022 0919   EOSABS 0.5 02/26/2022 0919   BASOSABS 0.1 02/26/2022 0919      LFT's    Latest Ref Rng & Units 02/26/2022    9:19 AM 04/29/2015    2:13 PM 03/09/2014   11:14 AM  Hepatic Function  Total Protein 6.5 - 8.1 g/dL 6.6  7.3  6.8  Albumin 3.5 - 5.0 g/dL 4.0  4.2  4.0   AST 15 - 41 U/L _0 ALT 0 - 44 U/L _1 Alk Phosphatase 38 - 126 U/L 61  76  65   Total Bilirubin 0.3 - 1.2 mg/dL 0.6  0.4  0.5   Bilirubin, Direct 0.0 - 0.2 mg/dL <0.1   0.1       HRCT (10/26/21) Moderate pulmonary fibrosis in a pattern with apical to basal gradient, featuring irregular peripheral interstitial opacity, septal thickening, areas of subpleural bronchiolectasis, and more clear evidence of honeycombing. Fibrotic findings are slightly worsened in comparison to prior examination dated 01/11/2020 as well as over a longer period of time dating back to 03/10/2014. Findings are consistent with UIP per consensus  guidelines  Assessment and Plan  Ofev Medication Management Thoroughly counseled patient on the efficacy, mechanism of action, dosing, administration, adverse effects, and monitoring parameters of Ofev. Patient verbalized understanding. Patient education handout provided.   Goals of Therapy: Will not stop or reverse the progression of ILD. It will slow the progression of ILD.  Inhibits tyrosine kinase inhibitors which slow the fibrosis/progression of ILD -Significant reduction in the rate of disease progression was observed after treatment (61.1% [before] vs 33.3% [after], P?=?0.008) over 42 weeks.  Dosing: 100 mg (one capsule) by mouth twice daily (approx 12 hours apart). Discussed taking with food approximately 12 hours apart. Discussed that capsule should not be crushed or split.  Adverse Effects: Nausea, vomiting, diarrhea (2 in 3 patients) appetite loss, weight loss - management of diarrhea with loperamide discussed including max use of 48 hours and max of 8 capsules per day. Abdominal pain (up to 1 in 5 patients) Nasopharyngitis (13%), UTI (6%) Risk of thrombosis (3%) and acute MI (2%) Hypertension (5%) Dizziness Fatigue (10%)  Monitoring: Monitor for diarrhea, nausea and vomiting, GI perforation, hepatotoxicity  Monitor LFTs - baseline, monthly for first 6 months, then every 3 months routinely Pregnancy test - baseline prn CBC w differential at baseline and every 3 months routinely  Access: Approval of Ofev through: patient assistance Rx sent to: [facility] [employer] for Ofev: [phone]  Medication Reconciliation A drug regimen assessment was performed, including review of allergies, interactions, disease-state management, dosing and immunization history. Medications were reviewed with the patient, including name, instructions, indication, goals of therapy, potential side effects, importance of adherence, and safe use.  Anticoagulant use: No  Immunizations Patient is indicated  for the influenzae, pneumonia, and shingles vaccinations. Patient has received 2 COVID19 vaccines.  Thank you for involving pharmacy to assist in providing this patient's care.   Lind Guest, PharmD PGY-1 [facility] Pharmacy Resident

## 2022-04-27 ENCOUNTER — Ambulatory Visit (INDEPENDENT_AMBULATORY_CARE_PROVIDER_SITE_OTHER): Payer: Medicare Other | Admitting: Pulmonary Disease

## 2022-04-27 ENCOUNTER — Encounter: Payer: Self-pay | Admitting: Pulmonary Disease

## 2022-04-27 VITALS — BP 124/80 | HR 76 | Temp 98.0°F | Ht 63.0 in | Wt 131.4 lb

## 2022-04-27 DIAGNOSIS — J84112 Idiopathic pulmonary fibrosis: Secondary | ICD-10-CM | POA: Diagnosis not present

## 2022-04-27 DIAGNOSIS — G4734 Idiopathic sleep related nonobstructive alveolar hypoventilation: Secondary | ICD-10-CM | POA: Diagnosis not present

## 2022-04-27 DIAGNOSIS — J301 Allergic rhinitis due to pollen: Secondary | ICD-10-CM

## 2022-04-27 DIAGNOSIS — R053 Chronic cough: Secondary | ICD-10-CM | POA: Diagnosis not present

## 2022-04-27 MED ORDER — SPIRIVA RESPIMAT 2.5 MCG/ACT IN AERS: 2.0000 | INHALATION_SPRAY | Freq: Every day | RESPIRATORY_TRACT | 0 refills | Status: DC

## 2022-04-27 NOTE — Progress Notes (Signed)
Subjective:    Patient ID: Cheyenne Wong, female    DOB: 1950/12/08, 71 y.o.   MRN: [id number] Chief Complaint  Patient presents with   Follow-up    UIP (usual interstitial pneumonitis). No wheezing. Cough with clear sputum. SOB in the morning.   Chief Complaint  Patient presents with   Follow-up    UIP (usual interstitial pneumonitis). No wheezing. Cough with clear sputum. SOB in the morning.     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 This is a scheduled visit.  Last visit here was on 26 February 2022, at that time she had paperwork for Ofev initiated and she has received the medication.  However, she has not started the medication citing family issues".  She is consistent with nocturnal oxygen.  Continues to have issues with a dry cough.  She notes that the Pulmicort nebulized and Spiriva helped her.  We provided her.  Her reflux symptoms are controlled with Pepcid.  As noted previously her CT and PFTs had shown mild decline from prior obtained at [facility].   Cheyenne Wong was also offered the opportunity to participate in a research program for control of cough in patients with pulmonary fibrosis however she declined this.   We discussed that her options are limited with regards to progression of disease she needs to try Ofev which we were able to procure for her.  Previously she had tried Esbriet but because of cost and perceived side effects she discontinued the medication.    She has had some nasal congestion with change of season with postnasal drip and states that her cough has been worse over the last 2 weeks because of this.  She has not had any fevers, chills or sweats.  No hemoptysis.  No weight loss or anorexia.  She has had some nasal congestion and worsening of her cough with the season change.  Requested Spiriva samples as she feels that this helps her cough.  Her shortness of breath, has not changed in  character, she feels that she is at baseline in this regard and other than the cough flare, feels overall well.  DATA 10/24/2021 overnight oximetry: Qualified for O2 with significant desaturations overnight as low as 84%. 10/26/2021 CT chest high-resolution: Slightly worsened fibrotic changes and honeycombing from prior CT of 11 January 2020, findings consistent with UIP. 12/13/2021 PFTs: FEV1 1.50 L or 67% predicted, FVC 1.87 L or 63% predicted, FEV1/FVC 76%. Lung volumes moderately reduced, there is a small airways component.  Diffusion capacity moderately reduced.  Compared to PFTs performed at [facility] clinic in April 2022 there has been slight decline in function.  Review of Systems A 10 point review of systems was performed and it is as noted above otherwise negative.    Objective:   Physical Exam BP 124/80 (BP Location: Left Arm, Cuff Size: Normal)   Pulse 76   Temp 98 F (36.7 C)   Ht 5\' 3"  (1.6 m)   Wt 131 lb 6.4 oz (59.6 kg)   SpO2 99%   BMI 23.28 kg/m  GENERAL: Well-developed, well-nourished woman, no acute distress.  Fully ambulatory, no conversational dyspnea.  No tachypnea noted. HEAD: Normocephalic, atraumatic.  EYES: Pupils equal, round, reactive to light.  No scleral icterus.  MOUTH: Oral mucosa moist.  No thrush, pharynx clear.  No angular cheilitis noted today. NECK: Supple. No thyromegaly. Trachea midline. No JVD.  No adenopathy. PULMONARY: Good air entry bilaterally.  Faint  bibasilar crackles. CARDIOVASCULAR: S1 and S2. Regular rate and rhythm.  No rubs, murmurs or gallops heard. ABDOMEN: Benign. MUSCULOSKELETAL: No joint deformity, no clubbing, no edema.  NEUROLOGIC: No overt focal deficit, no gait disturbance, speech is fluent. SKIN: Intact,warm,dry. PSYCH: Mood and behavior normal.      Assessment & Plan:     ICD-10-CM   1. UIP (usual interstitial pneumonitis) ([place])  J84.112    Needs to start Ofev 100 mg twice daily Patient has medication Urged to start  if she does not lose qualification    2. Chronic cough  R05.3    Driven by pulmonary fibrosis mostly Some element of allergies/seasonal change Zyrtec at bedtime May use Spiriva as needed    3. Nocturnal hypoxemia  G47.34    Continue oxygen at 2 L/min    4. Seasonal allergic rhinitis due to pollen  J30.1    States she is sensitive to ragweed Zyrtec at bedtime recommended     Meds ordered this encounter  Medications   Tiotropium Bromide Monohydrate (SPIRIVA RESPIMAT) 2.5 MCG/ACT AERS    Sig: Inhale 2 puffs into the lungs daily.    Dispense:  8 g    Refill:  0    Order Specific Question:   Lot Number?    Answer:   [id number]    Order Specific Question:   Expiration Date?    Answer:   10/15/2023    Order Specific Question:   Quantity    Answer:   2   Patient was encouraged to start her Ofev as soon as possible.  If she does not use the medication she moves the qualification for coverage for it.  We did provide her with some samples of Spiriva 2.5 mcg which she feels helps with her cough somewhat.  Advised to take Zyrtec at nighttime to see if this helps with her allergic symptoms.  We will see her in follow-up in 6 to 8 weeks time call sooner should any new problems arise.  Renold Don, MD Advanced Bronchoscopy PCCM [facility]    *This note was dictated using voice recognition software/Dragon.  Despite best efforts to proofread, errors can occur which can change the meaning. Any transcriptional errors that result from this process are unintentional and may not be fully corrected at the time of dictation.

## 2022-04-27 NOTE — Patient Instructions (Addendum)
You can take Zyrtec at bedtime this is 10 mg.  Zyrtec is available in either tablets or syrup.  If you notice that you are too groggy with the medication you may cut the tablet in half or take just 5 mg of the syrup.  Start your Ofev as soon as you can.  We are giving you some samples of the Spiriva 2.5 mcg, 2 puffs once a day as needed for cough.  We will see you in follow-up in 6 to 8 weeks time call sooner should any new problems arise.

## 2022-06-13 ENCOUNTER — Ambulatory Visit: Payer: Medicare Other | Admitting: Pulmonary Disease

## 2022-07-20 ENCOUNTER — Encounter: Payer: Self-pay | Admitting: Pulmonary Disease

## 2022-07-20 ENCOUNTER — Ambulatory Visit
Admission: RE | Admit: 2022-07-20 | Discharge: 2022-07-20 | Disposition: A | Discharge status: Home / Self Care | Payer: Medicare Other | Source: Ambulatory Visit | Attending: Pulmonary Disease | Admitting: Pulmonary Disease

## 2022-07-20 ENCOUNTER — Ambulatory Visit (INDEPENDENT_AMBULATORY_CARE_PROVIDER_SITE_OTHER): Payer: Medicare Other | Admitting: Pulmonary Disease

## 2022-07-20 VITALS — BP 122/78 | HR 74 | Temp 98.0°F | Ht 63.0 in | Wt 128.0 lb

## 2022-07-20 DIAGNOSIS — R053 Chronic cough: Secondary | ICD-10-CM | POA: Diagnosis not present

## 2022-07-20 DIAGNOSIS — R0602 Shortness of breath: Secondary | ICD-10-CM | POA: Insufficient documentation

## 2022-07-20 DIAGNOSIS — G4734 Idiopathic sleep related nonobstructive alveolar hypoventilation: Secondary | ICD-10-CM | POA: Diagnosis not present

## 2022-07-20 DIAGNOSIS — J84112 Idiopathic pulmonary fibrosis: Secondary | ICD-10-CM | POA: Diagnosis not present

## 2022-07-20 MED ORDER — METHYLPREDNISOLONE 4 MG PO TBPK: ORAL_TABLET | ORAL | 0 refills | Status: DC

## 2022-07-20 MED ORDER — AZITHROMYCIN 250 MG PO TABS: ORAL_TABLET | ORAL | 0 refills | Status: AC

## 2022-07-20 MED ORDER — BREZTRI AEROSPHERE 160-9-4.8 MCG/ACT IN AERO: 2.0000 | INHALATION_SPRAY | Freq: Two times a day (BID) | RESPIRATORY_TRACT | 0 refills | Status: DC

## 2022-07-20 NOTE — Progress Notes (Signed)
Subjective:    Patient ID: Cheyenne Wong, female    DOB: 1951-05-12, 72 y.o.   MRN: [id number] Patient Care Team: Adin Hector, MD as PCP - General (Internal Medicine) Tyler Pita, MD as Consulting Physician (Pulmonary Disease)  Chief Complaint  Patient presents with   Follow-up    SOB constant since 07/04/2022. Cough with white sputum. Little wheezing.     HPI Cheyenne Wong is a 72 year old lifelong never smoker with UIP/pulmonary fibrosis and nocturnal hypoxemia who presents for follow-up. This is a scheduled visit.  Last visit here was on 24 April 2022, at that time she had Ofev at hand however, she had not started the medication citing "family issues".  She is consistent with nocturnal oxygen use.  Continues to have issues with a dry cough.  She notes that the Pulmicort nebulized does not help.  Spiriva helps her cough.Her reflux symptoms are controlled with Pepcid.  As noted previously her CT and PFTs had shown mild decline from prior obtained at [facility].  She presents today states that she has as of yet the Ofev.  She keeps putting excuses as to why she has not started it.  I have advised her that if she does not take the medication she will lose the eligibility for assistance to get it. We discussed that her options are limited with regards to progression of disease she needs to try Ofev which we were able to procure for her.  Previously she had tried Esbriet but because of cost and perceived side effects she discontinued the medication. Cheyenne Wong was also offered the opportunity to participate in a research program for control of cough in patients with pulmonary fibrosis however she declined this.   She had a viral illness starting on December 20, she states her "whole family" had it, nobody got tested for flu, COVID or RSV.  She states that she just stayed at home and "toughed it out".  Since then she has noted increased dry cough and shortness of breath as well as some  wheezing.  She has not had any recent fevers, chills or sweats.  No hemoptysis.  She is out of Spiriva and would like samples, states she cannot afford the medication, we do not have samples today.  DATA 10/24/2021 overnight oximetry: Qualified for O2 with significant desaturations overnight as low as 84%. 10/26/2021 CT chest high-resolution: Slightly worsened fibrotic changes and honeycombing from prior CT of 11 January 2020, findings consistent with UIP. 12/13/2021 PFTs: FEV1 1.50 L or 67% predicted, FVC 1.87 L or 63% predicted, FEV1/FVC 76%. Lung volumes moderately reduced, there is a small airways component.  Diffusion capacity moderately reduced.  Compared to PFTs performed at [facility] clinic in April 2022 there has been slight decline in function.  Review of Systems A 10 point review of systems was performed and it is as noted above otherwise negative.  Patient Active Problem List   Diagnosis Date Noted   GERD (gastroesophageal reflux disease) 11/16/2021   Prediabetes 07/29/2019   Other dysphagia 04/01/2017   Pulmonary fibrosis ([place]) 10/15/2016   Need for hepatitis C screening test 06/02/2015   Screening for breast cancer 04/29/2015   Hypothyroidism 08/26/2012   Vitamin D deficiency 10/02/2011   Hyperlipidemia 09/04/2011   Social History   Tobacco Use   Smoking status: Never   Smokeless tobacco: Never  Substance Use Topics   Alcohol use: No   Allergies  Allergen Reactions   Tramadol Shortness Of Breath   Penicillins  Allergy as a child   Current Meds  Medication Sig   atorvastatin (LIPITOR) 10 MG tablet Take 10 mg by mouth daily.   budesonide (PULMICORT) 0.25 MG/2ML nebulizer solution Take 2 mLs (0.25 mg total) by nebulization 2 (two) times daily.   chlorpheniramine-HYDROcodone (TUSSIONEX) 10-8 MG/5ML Take by mouth. Take 5 mLs by mouth every 12 (twelve) hours as needed for Cough   cholecalciferol (VITAMIN D) 25 MCG (1000 UNIT) tablet Take 1,000 Units by mouth daily.    esomeprazole (NEXIUM) 40 MG capsule Take 40 mg by mouth daily at 12 noon.   famotidine (PEPCID) 20 MG tablet Take 20 mg by mouth 2 (two) times daily.   ibuprofen (ADVIL) 200 MG tablet Take 200 mg by mouth every 6 (six) hours as needed.   Tiotropium Bromide Monohydrate (SPIRIVA RESPIMAT) 2.5 MCG/ACT AERS Inhale 2 puffs into the lungs daily.   Immunization History  Administered Date(s) Administered   Influenza Inj Mdck Quad Pf 05/08/2018, 05/25/2019, 06/16/2020, 06/02/2021   Influenza Split 05/04/2011, 05/19/2012   Influenza,inj,Quad PF,6+ Mos 03/31/2013, 06/02/2015   Influenza,inj,quad, With Preservative 04/15/2016   Influenza-Unspecified 06/03/2017   Janssen (J&J) SARS-COV-2 Vaccination 10/23/2019   PFIZER Comirnaty(Gray Top)Covid-19 Tri-Sucrose Vaccine 05/23/2020   Pneumococcal Conjugate-13 06/20/2018   Pneumococcal Polysaccharide-23 07/16/2013, 07/29/2019   Tdap 07/16/2008, 07/28/2018        Objective:   Physical Exam BP 122/78 (BP Location: Left Arm, Patient Position: Sitting, Cuff Size: Normal)   Pulse 74   Temp 98 F (36.7 C)   Ht 5\' 3"  (1.6 m)   Wt 128 lb (58.1 kg)   SpO2 99%   BMI 22.67 kg/m   GENERAL: Well-developed, well-nourished woman, no acute distress.  Fully ambulatory, no conversational dyspnea.  No tachypnea noted. HEAD: Normocephalic, atraumatic.  EYES: Pupils equal, round, reactive to light.  No scleral icterus.  MOUTH: Oral mucosa moist.  No thrush, pharynx clear.  No angular cheilitis noted today. NECK: Supple. No thyromegaly. Trachea midline. No JVD.  No adenopathy. PULMONARY: Good air entry bilaterally.  Few scattered wheezes, pops and faint bibasilar crackles. CARDIOVASCULAR: S1 and S2. Regular rate and rhythm.  No rubs, murmurs or gallops heard. ABDOMEN: Benign. MUSCULOSKELETAL: No joint deformity, no clubbing, no edema.  NEUROLOGIC: No overt focal deficit, no gait disturbance, speech is fluent. SKIN: Intact,warm,dry. PSYCH: Mood and behavior  normal.     Assessment & Plan:     ICD-10-CM   1. Chronic cough  R05.3 DG Chest 2 View   Due to IPF Notes some relief with Spiriva (cannot afford) Trial of Breztri Needs to start Ofev    2. SOB (shortness of breath)  R06.02 DG Chest 2 View   Due to IPF/UIP Slight worsening due to recent illness Chest x-ray    3. UIP (usual interstitial pneumonitis) (HCC) - IPF  V76.160    Likely mild flare after viral illness Medrol Dosepak Azithromycin Dosepak NEEDS TO START OFEV after she finishes acute med course    4. Nocturnal hypoxemia  G47.34    Compliant with nocturnal O2 Continue nocturnal O2     Orders Placed This Encounter  Procedures   DG Chest 2 View    Standing Status:   Future    Standing Expiration Date:   07/21/2023    Order Specific Question:   Reason for Exam (SYMPTOM  OR DIAGNOSIS REQUIRED)    Answer:   SOB/Cough    Order Specific Question:   Preferred imaging location?    Answer:   [facility]  Meds ordered this encounter  Medications   Budeson-Glycopyrrol-Formoterol (BREZTRI AEROSPHERE) 160-9-4.8 MCG/ACT AERO    Sig: Inhale 2 puffs into the lungs in the morning and at bedtime.    Dispense:  11.8 g    Refill:  0    Order Specific Question:   Lot Number?    Answer:   [id number]    Order Specific Question:   Expiration Date?    Answer:   10/14/2024    Order Specific Question:   Manufacturer?    Answer:   [employer] [[id number]]    Order Specific Question:   Quantity    Answer:   2   methylPREDNISolone (MEDROL DOSEPAK) 4 MG TBPK tablet    Sig: Take as directed in the package    Dispense:  21 tablet    Refill:  0   azithromycin (ZITHROMAX) 250 MG tablet    Sig: Take 2 tablets (500 mg) on  Day 1,  followed by 1 tablet (250 mg) once daily on Days 2 through 5.    Dispense:  6 each    Refill:  0   Obtain chest x-ray to evaluate her current acute process.  She was encouraged to start her over the after she finishes her acute course of medications.  Will see the  patient in follow-up in 4 to 6 weeks time she is to contact us prior to that time should any new problems arise.  Gailen Shelter, MD Advanced Bronchoscopy PCCM [facility]    *This note was dictated using voice recognition software/Dragon.  Despite best efforts to proofread, errors can occur which can change the meaning. Any transcriptional errors that result from this process are unintentional and may not be fully corrected at the time of dictation.

## 2022-07-20 NOTE — Patient Instructions (Signed)
We have put in an order for chest x-ray today.  We have sent a Medrol (prednisone) Dosepak and azithromycin (antibiotic) to your pharmacy.  Please take as directed.  Please start the Ofev after you finish your Medrol and your antibiotic.  We have provided you samples with a new inhaler called Breztri 2 puffs twice a day also provided you with a spacer to get the medication deeper into your lungs.  We will see you in follow-up in 4 to 6 weeks time call sooner should any new problems arise.

## 2022-08-28 ENCOUNTER — Ambulatory Visit: Payer: Medicare Other | Admitting: Pulmonary Disease

## 2022-09-27 ENCOUNTER — Encounter: Payer: Self-pay | Admitting: Pulmonary Disease

## 2022-09-27 ENCOUNTER — Ambulatory Visit (INDEPENDENT_AMBULATORY_CARE_PROVIDER_SITE_OTHER): Payer: Medicare Other | Admitting: Pulmonary Disease

## 2022-09-27 VITALS — BP 110/82 | HR 68 | Temp 97.8°F | Ht 63.0 in | Wt 126.2 lb

## 2022-09-27 DIAGNOSIS — R053 Chronic cough: Secondary | ICD-10-CM | POA: Diagnosis not present

## 2022-09-27 DIAGNOSIS — J84112 Idiopathic pulmonary fibrosis: Secondary | ICD-10-CM | POA: Diagnosis not present

## 2022-09-27 DIAGNOSIS — R0602 Shortness of breath: Secondary | ICD-10-CM | POA: Diagnosis not present

## 2022-09-27 DIAGNOSIS — J3089 Other allergic rhinitis: Secondary | ICD-10-CM

## 2022-09-27 LAB — NITRIC OXIDE: Nitric Oxide: 7

## 2022-09-27 MED ORDER — BREZTRI AEROSPHERE 160-9-4.8 MCG/ACT IN AERO: 2.0000 | INHALATION_SPRAY | Freq: Two times a day (BID) | RESPIRATORY_TRACT | 11 refills | Status: DC

## 2022-09-27 MED ORDER — BREZTRI AEROSPHERE 160-9-4.8 MCG/ACT IN AERO: 2.0000 | INHALATION_SPRAY | Freq: Two times a day (BID) | RESPIRATORY_TRACT | 0 refills | Status: DC

## 2022-09-27 NOTE — Progress Notes (Signed)
Subjective:    Patient ID: Cheyenne Wong, female    DOB: 05-22-1951, 72 y.o.   MRN: [id number] Patient Care Team: Adin Hector, MD as PCP - General (Internal Medicine) Tyler Pita, MD as Consulting Physician (Pulmonary Disease)  Chief Complaint  Patient presents with   Follow-up    SOB "all the time." Some wheezing. Dry Cough.     HPI Cheyenne Wong is a 72 year old lifelong never smoker with UIP/pulmonary fibrosis and nocturnal hypoxemia who presents for follow-up. This is a scheduled visit.  Last visit here was on 20 July 2022, at that time she had Ofev at hand however, she had not started the medication.  She is consistent with nocturnal oxygen use.  Continues to have issues with a dry cough.  She notes that the Pulmicort nebulized does not help.  She was given a trial of Breztri during her last visit and this helps her. Her reflux symptoms are controlled with Pepcid.  She states that she started Ofev however felt that this gave her nausea and her legs felt "tight" and she discontinued the medication after 3 to 4 weeks of therapy.  She never called [place] with the symptoms. We discussed that her options are limited with regards to progression of disease, she is aware of this.  Cheyenne Wong was also offered the opportunity to participate in a research program for control of cough in patients with pulmonary fibrosis however she declined this.   She has not had any recent fevers, chills or sweats.  No hemoptysis.  She does find Breztri useful and would like a prescription for it.  Endorse any issues with orthopnea, paroxysmal nocturnal dyspnea, chest pain, lower extremity edema or calf tenderness.  She has not had significant postnasal drip lately due to "allergies".  No other concerns expressed today.  THERAPIES tried and failed: Esbriet (pirfenidone) - d/c'd due to "side effects" + cost Ofev (nintendanib) - nausea, legs felt tight  DATA 10/24/2021 overnight oximetry: Qualified for O2 with  significant desaturations overnight as low as 84%. 10/26/2021 CT chest high-resolution: Slightly worsened fibrotic changes and honeycombing from prior CT of 11 January 2020, findings consistent with UIP. 12/13/2021 PFTs: FEV1 1.50 L or 67% predicted, FVC 1.87 L or 63% predicted, FEV1/FVC 76%. Lung volumes moderately reduced, there is a small airways component.  Diffusion capacity moderately reduced.  Compared to PFTs performed at [facility] clinic in April 2022 there has been slight decline in function.  Review of Systems A 10 point review of systems was performed and it is as noted above otherwise negative.  Patient Active Problem List   Diagnosis Date Noted   GERD (gastroesophageal reflux disease) 11/16/2021   Prediabetes 07/29/2019   Other dysphagia 04/01/2017   Pulmonary fibrosis ([place]) 10/15/2016   Need for hepatitis C screening test 06/02/2015   Screening for breast cancer 04/29/2015   Hypothyroidism 08/26/2012   Vitamin D deficiency 10/02/2011   Hyperlipidemia 09/04/2011   Social History   Tobacco Use   Smoking status: Never   Smokeless tobacco: Never  Substance Use Topics   Alcohol use: No   Allergies  Allergen Reactions   Tramadol Shortness Of Breath   Penicillins     Allergy as a child   Current Meds  Medication Sig   atorvastatin (LIPITOR) 10 MG tablet Take 10 mg by mouth daily.   Budeson-Glycopyrrol-Formoterol (BREZTRI AEROSPHERE) 160-9-4.8 MCG/ACT AERO Inhale 2 puffs into the lungs in the morning and at bedtime.   budesonide (PULMICORT) 0.25 MG/2ML  nebulizer solution Take 2 mLs (0.25 mg total) by nebulization 2 (two) times daily.   chlorpheniramine-HYDROcodone (TUSSIONEX) 10-8 MG/5ML Take by mouth. Take 5 mLs by mouth every 12 (twelve) hours as needed for Cough   cholecalciferol (VITAMIN D) 25 MCG (1000 UNIT) tablet Take 1,000 Units by mouth daily.   escitalopram (LEXAPRO) 5 MG tablet Take 5 mg by mouth as needed.   esomeprazole (NEXIUM) 40 MG capsule Take 40 mg by  mouth daily at 12 noon.   famotidine (PEPCID) 20 MG tablet Take 20 mg by mouth 2 (two) times daily.   ibuprofen (ADVIL) 200 MG tablet Take 200 mg by mouth every 6 (six) hours as needed.   Immunization History  Administered Date(s) Administered   Influenza Inj Mdck Quad Pf 05/08/2018, 05/25/2019, 06/16/2020, 06/02/2021   Influenza Split 05/04/2011, 05/19/2012   Influenza,inj,Quad PF,6+ Mos 03/31/2013, 06/02/2015   Influenza,inj,quad, With Preservative 04/15/2016   Influenza-Unspecified 06/03/2017   Janssen (J&J) SARS-COV-2 Vaccination 10/23/2019   Moderna SARS-COV2 Booster Vaccination 06/24/2022   PFIZER Comirnaty(Gray Top)Covid-19 Tri-Sucrose Vaccine 05/23/2020   Pneumococcal Conjugate-13 06/20/2018   Pneumococcal Polysaccharide-23 07/16/2013, 07/29/2019   Tdap 07/16/2008, 07/28/2018       Objective:   Physical Exam BP 110/82 (BP Location: Left Arm, Cuff Size: Normal)   Pulse 68   Temp 97.8 F (36.6 C)   Ht '5\' 3"'$  (1.6 m)   Wt 126 lb 3.2 oz (57.2 kg)   SpO2 100%   BMI 22.36 kg/m   SpO2: 100 % O2 Device: None (Room air)  GENERAL: Well-developed, well-nourished woman, no acute distress.  Fully ambulatory, no conversational dyspnea.  No tachypnea noted.  Occasionally will have dry cough. HEAD: Normocephalic, atraumatic.  EYES: Pupils equal, round, reactive to light.  No scleral icterus.  MOUTH: Oral mucosa moist.  No thrush, pharynx clear.  No angular cheilitis noted today. NECK: Supple. No thyromegaly. Trachea midline. No JVD.  No adenopathy. PULMONARY: Good air entry bilaterally.  Few scattered wheezes, pops and faint bibasilar crackles. CARDIOVASCULAR: S1 and S2. Regular rate and rhythm.  No rubs, murmurs or gallops heard. ABDOMEN: Benign. MUSCULOSKELETAL: No joint deformity, no clubbing, no edema.  NEUROLOGIC: No overt focal deficit, no gait disturbance, speech is fluent. SKIN: Intact,warm,dry. PSYCH: Mood and behavior normal.     Lab Results  Component Value Date    NITRICOXIDE 7 09/27/2022   Ambulatory symmetry was performed today: Patient did not exhibit desaturations during exercise.  She was able to do all 3 laps O2 nadir was 95%.     Assessment & Plan:     ICD-10-CM   1. 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 SOB (shortness of breath)  R06.02 Nitric oxide   At baseline per patient Note Judithann Sauger helps some She has mild obstructive component on her PFTs    3. Chronic cough  R05.3    Has declined research program Notes better control with Breztri No evidence of type II inflammation FeNO today    4. Perennial allergic rhinitis  J30.89    Significant postnasal drip Trial Zyrtec 10 mg at bedtime     Meds ordered this encounter  Medications   Budeson-Glycopyrrol-Formoterol (BREZTRI AEROSPHERE) 160-9-4.8 MCG/ACT AERO    Sig: Inhale 2 puffs into the lungs in the morning and at bedtime.    Dispense:  5.9 g  Refill:  0    Order Specific Question:   Lot Number?    Answer:   [id number]    Order Specific Question:   Expiration Date?    Answer:   10/14/2024    Order Specific Question:   Manufacturer?    Answer:   [employer] [[id number]]    Order Specific Question:   Quantity    Answer:   2   Budeson-Glycopyrrol-Formoterol (BREZTRI AEROSPHERE) 160-9-4.8 MCG/ACT AERO    Sig: Inhale 2 puffs into the lungs in the morning and at bedtime.    Dispense:  10.7 g    Refill:  11   Will see the patient in follow-up in 4 months time she is to contact us prior to that time should any new difficulties arise.  Renold Don, MD Advanced Bronchoscopy PCCM [facility]    *This note was dictated using voice recognition software/Dragon.  Despite best efforts to proofread, errors can occur which can change the meaning. Any transcriptional errors that result from this process are  unintentional and may not be fully corrected at the time of dictation.

## 2022-09-27 NOTE — Patient Instructions (Signed)
We have given you samples of the Breztri.  I sent in a prescription that will kick in around April since you have enough for a months worth.  You may take Zyrtec at nighttime to see if this helps with your cough during the day.  Also to see if this helps with your postnasal drip.  You did very well on both tests that were performed today.  We will see you in follow-up in 4 months time call sooner should any new problems arise.

## 2022-11-09 ENCOUNTER — Other Ambulatory Visit: Payer: Self-pay | Admitting: Internal Medicine

## 2022-11-09 DIAGNOSIS — Z1231 Encounter for screening mammogram for malignant neoplasm of breast: Secondary | ICD-10-CM

## 2022-12-06 ENCOUNTER — Ambulatory Visit
Admission: RE | Admit: 2022-12-06 | Discharge: 2022-12-06 | Disposition: A | Discharge status: Home / Self Care | Payer: Medicare Other | Source: Ambulatory Visit | Attending: Internal Medicine | Admitting: Internal Medicine

## 2022-12-06 DIAGNOSIS — Z1231 Encounter for screening mammogram for malignant neoplasm of breast: Secondary | ICD-10-CM | POA: Diagnosis present

## 2022-12-24 ENCOUNTER — Ambulatory Visit: Payer: Medicare Other | Admitting: Pulmonary Disease

## 2023-01-10 ENCOUNTER — Encounter: Payer: Self-pay | Admitting: Ophthalmology

## 2023-01-10 NOTE — Anesthesia Preprocedure Evaluation (Addendum)
Anesthesia Evaluation  Patient identified by MRN, date of birth, ID band Patient awake    Reviewed: Allergy & Precautions, H&P , NPO status , Patient's Chart, lab work & pertinent test results  Airway Mallampati: III  TM Distance: <3 FB    Comment: Very short TMD.  Patient aware she may not be very sedated. Must be able to keep pulse oximetry at safe levels. Wears home oxygen 2 L/m/n/c at night. Has severe GERD, took Nexium today. Coughs often, instructed to please notify CRNA if she needs to cough.  Will try to decrease this chance but no guarantees.  Dental   Pulmonary shortness of breath 10-26-21 IMPRESSION: 1. Moderate pulmonary fibrosis in a pattern with apical to basal gradient, featuring irregular peripheral interstitial opacity, septal thickening, areas of subpleural bronchiolectasis, and more clear evidence of honeycombing. Fibrotic findings are slightly worsened in comparison to prior examination dated 01/11/2020 as well as over a longer period of time dating back to 03/10/2014. Findings are consistent with UIP per consensus guidelines: Diagnosis of Idiopathic Pulmonary Fibrosis: An Official ATS/ERS/JRS/ALAT Clinical Practice Guideline. Am Rosezetta Schlatter Crit Care Med Vol 198, Iss 5, [phone], Mar 16 2017. 2. Aortic atherosclerosis.   mild airways reactivity noted on PFTs           Cardiovascular negative cardio ROS   10/25/21    1. Left ventricular ejection fraction, by estimation, is 60 to 65%. The  left ventricle has normal function. The left ventricle has no regional  wall motion abnormalities. Left ventricular diastolic parameters are  consistent with Grade I diastolic  dysfunction (impaired relaxation).   2. Right ventricular systolic function is normal. The right ventricular  size is normal. There is normal pulmonary artery systolic pressure. The  estimated right ventricular systolic pressure is 12.2 mmHg.   3. The  mitral valve is normal in structure. Mild mitral valve  regurgitation. No evidence of mitral stenosis.   4. The aortic valve was not well visualized. Aortic valve regurgitation  is mild. No aortic stenosis is present.   5. The inferior vena cava is normal in size with greater than 50%  respiratory variability, suggesting right atrial pressure of 3 mmHg.   Comparison(s): Stress echo performed at outside facility: LVEF >55%.      Neuro/Psych  PSYCHIATRIC DISORDERS Anxiety     negative neurological ROS  negative psych ROS   GI/Hepatic Neg liver ROS,GERD  ,,  Endo/Other  Hypothyroidism    Renal/GU negative Renal ROS  negative genitourinary   Musculoskeletal negative musculoskeletal ROS (+)    Abdominal   Peds negative pediatric ROS (+)  Hematology negative hematology ROS (+)   Anesthesia Other Findings Medical History Panic disorder  Vitamin D deficiency GERD (gastroesophageal reflux disease) Right knee meniscal tear UIP (usual interstitial pneumonitis)  Hyperparathyroidism (HCC) Pulmonary fibrosis (HCC)  Chronic cough Dyspnea  Tremor Grade I diastolic dysfunction Motion sickness Mild aortic regurgitation  Mild mitral regurgitation by prior echocardiogram Grade I diastolic dysfunction  Mild tricuspid regurgitation by prior echocardiogram Chronic cough Nocturnal hypoxemia Aortic atherosclerosis (HCC)  Polyarthralgia Dependence on nocturnal oxygen therapy History of chronic rhinitis    Reproductive/Obstetrics negative OB ROS                             Anesthesia Physical Anesthesia Plan  ASA: 3  Anesthesia Plan: MAC   Post-op Pain Management:    Induction: Intravenous  PONV Risk Score and Plan:   Airway  Management Planned: Natural Airway and Nasal Cannula  Additional Equipment:   Intra-op Plan:   Post-operative Plan:   Informed Consent: I have reviewed the patients History and Physical, chart, labs and discussed the  procedure including the risks, benefits and alternatives for the proposed anesthesia with the patient or authorized representative who has indicated his/her understanding and acceptance.     Dental Advisory Given  Plan Discussed with: Anesthesiologist, CRNA and Surgeon  Anesthesia Plan Comments: (Patient consented for risks of anesthesia including but not limited to:  - adverse reactions to medications - damage to eyes, teeth, lips or other oral mucosa - nerve damage due to positioning  - sore throat or hoarseness - Damage to heart, brain, nerves, lungs, other parts of body or loss of life  Patient voiced understanding.)        Anesthesia Quick Evaluation

## 2023-01-11 NOTE — Discharge Instructions (Signed)

## 2023-01-16 ENCOUNTER — Ambulatory Visit
Admission: RE | Admit: 2023-01-16 | Discharge: 2023-01-16 | Disposition: A | Discharge status: Home / Self Care | Payer: Medicare Other | Source: Ambulatory Visit | Attending: Ophthalmology | Admitting: Ophthalmology

## 2023-01-16 ENCOUNTER — Ambulatory Visit: Payer: Medicare Other | Admitting: Anesthesiology

## 2023-01-16 ENCOUNTER — Encounter: Payer: Self-pay | Admitting: Ophthalmology

## 2023-01-16 ENCOUNTER — Encounter
Admission: RE | Disposition: A | Discharge status: Home / Self Care | Payer: Self-pay | Source: Ambulatory Visit | Attending: Ophthalmology

## 2023-01-16 ENCOUNTER — Other Ambulatory Visit: Payer: Self-pay

## 2023-01-16 DIAGNOSIS — H2512 Age-related nuclear cataract, left eye: Secondary | ICD-10-CM | POA: Diagnosis not present

## 2023-01-16 HISTORY — DX: Motion sickness, initial encounter: T75.3XXA

## 2023-01-16 HISTORY — DX: Dyspnea, unspecified: R06.00

## 2023-01-16 HISTORY — PX: CATARACT EXTRACTION W/PHACO: SHX586

## 2023-01-16 HISTORY — DX: Atherosclerosis of aorta: I70.0

## 2023-01-16 HISTORY — DX: Nonrheumatic mitral (valve) insufficiency: I34.0

## 2023-01-16 HISTORY — DX: Pulmonary fibrosis, unspecified: J84.10

## 2023-01-16 HISTORY — DX: Rheumatic tricuspid insufficiency: I07.1

## 2023-01-16 HISTORY — DX: Nonrheumatic aortic (valve) insufficiency: I35.1

## 2023-01-16 HISTORY — DX: Pain in unspecified joint: M25.50

## 2023-01-16 HISTORY — DX: Chronic cough: R05.3

## 2023-01-16 HISTORY — DX: Dependence on supplemental oxygen: Z99.81

## 2023-01-16 HISTORY — DX: Other ill-defined heart diseases: I51.89

## 2023-01-16 HISTORY — DX: Idiopathic sleep related nonobstructive alveolar hypoventilation: G47.34

## 2023-01-16 HISTORY — DX: Personal history of other diseases of the respiratory system: Z87.09

## 2023-01-16 SURGERY — PHACOEMULSIFICATION, CATARACT, WITH IOL INSERTION
Anesthesia: Monitor Anesthesia Care | Site: Eye | Laterality: Left

## 2023-01-16 MED ORDER — SIGHTPATH DOSE#1 BSS IO SOLN
INTRAOCULAR | Status: DC | PRN
  Administered 2023-01-16: 1 mL via INTRAMUSCULAR

## 2023-01-16 MED ORDER — SIGHTPATH DOSE#1 BSS IO SOLN
INTRAOCULAR | Status: DC | PRN
  Administered 2023-01-16: 92 mL via OPHTHALMIC

## 2023-01-16 MED ORDER — TETRACAINE HCL 0.5 % OP SOLN
1.0000 [drp] | OPHTHALMIC | Status: DC | PRN
  Administered 2023-01-16 (×3): 1 [drp] via OPHTHALMIC

## 2023-01-16 MED ORDER — MIDAZOLAM HCL 2 MG/2ML IJ SOLN
INTRAMUSCULAR | Status: DC | PRN
  Administered 2023-01-16: 1 mg via INTRAVENOUS

## 2023-01-16 MED ORDER — LACTATED RINGERS IV SOLN: INTRAVENOUS | Status: DC

## 2023-01-16 MED ORDER — ARMC OPHTHALMIC DILATING DROPS
1.0000 | OPHTHALMIC | Status: DC | PRN
  Administered 2023-01-16 (×3): 1 via OPHTHALMIC

## 2023-01-16 MED ORDER — MOXIFLOXACIN HCL 0.5 % OP SOLN
OPHTHALMIC | Status: DC | PRN
  Administered 2023-01-16: .2 mL via OPHTHALMIC

## 2023-01-16 MED ORDER — FENTANYL CITRATE (PF) 100 MCG/2ML IJ SOLN
INTRAMUSCULAR | Status: DC | PRN
  Administered 2023-01-16: 50 ug via INTRAVENOUS

## 2023-01-16 MED ORDER — BRIMONIDINE TARTRATE-TIMOLOL 0.2-0.5 % OP SOLN
OPHTHALMIC | Status: DC | PRN
  Administered 2023-01-16: 1 [drp] via OPHTHALMIC

## 2023-01-16 MED ORDER — SIGHTPATH DOSE#1 BSS IO SOLN
INTRAOCULAR | Status: DC | PRN
  Administered 2023-01-16: 15 mL

## 2023-01-16 MED ORDER — LIDOCAINE HCL (CARDIAC) PF 100 MG/5ML IV SOSY
PREFILLED_SYRINGE | INTRAVENOUS | Status: DC | PRN
  Administered 2023-01-16: 50 mg via INTRATRACHEAL

## 2023-01-16 MED ORDER — SIGHTPATH DOSE#1 NA HYALUR & NA CHOND-NA HYALUR IO KIT
PACK | INTRAOCULAR | Status: DC | PRN
  Administered 2023-01-16: 1 via OPHTHALMIC

## 2023-01-16 SURGICAL SUPPLY — 9 items
CATARACT SUITE SIGHTPATH (MISCELLANEOUS) ×1 IMPLANT
FEE CATARACT SUITE SIGHTPATH (MISCELLANEOUS) ×1 IMPLANT
GLOVE SRG 8 PF TXTR STRL LF DI (GLOVE) ×1 IMPLANT
GLOVE SURG ENC TEXT LTX SZ7.5 (GLOVE) ×1 IMPLANT
GLOVE SURG UNDER POLY LF SZ8 (GLOVE) ×1
LENS IOL TECNIS EYHANCE 18.5 (Intraocular Lens) IMPLANT
NDL FILTER BLUNT 18X1 1/2 (NEEDLE) ×1 IMPLANT
NEEDLE FILTER BLUNT 18X1 1/2 (NEEDLE) ×1 IMPLANT
SYR 3ML LL SCALE MARK (SYRINGE) ×1 IMPLANT

## 2023-01-16 NOTE — Transfer of Care (Signed)
Immediate Anesthesia Transfer of Care Note  Patient: Cheyenne Wong  Procedure(s) Performed: CATARACT EXTRACTION PHACO AND INTRAOCULAR LENS PLACEMENT (IOC) LEFT 9.09  00:49.1 (Left: Eye)  Patient Location: PACU  Anesthesia Type: MAC  Level of Consciousness: awake, alert  and patient cooperative  Airway and Oxygen Therapy: Patient Spontanous Breathing and Patient connected to supplemental oxygen  Post-op Assessment: Post-op Vital signs reviewed, Patient's Cardiovascular Status Stable, Respiratory Function Stable, Patent Airway and No signs of Nausea or vomiting  Post-op Vital Signs: Reviewed and stable  Complications: No notable events documented.

## 2023-01-16 NOTE — H&P (Signed)
[facility]   Primary Care Physician:  Lynnea Ferrier, MD Ophthalmologist: Dr. Lockie Mola  Pre-Procedure History & Physical: HPI:  Cheyenne Wong is a 72 y.o. female here for ophthalmic surgery.   Past Medical History:  Diagnosis Date   Aortic atherosclerosis (HCC)    Chronic cough    Chronic cough    Dependence on nocturnal oxygen therapy    Dyspnea    from Pulmonary fibrosis   GERD (gastroesophageal reflux disease)    Grade I diastolic dysfunction    History of chronic rhinitis    Hyperparathyroidism (HCC)    Mild aortic regurgitation    Mild mitral regurgitation by prior echocardiogram    Mild tricuspid regurgitation by prior echocardiogram    Motion sickness    back seat of car   Nocturnal hypoxemia    Panic disorder    Polyarthralgia    Pulmonary fibrosis (HCC)    Right knee meniscal tear    UIP (usual interstitial pneumonitis) (HCC)    Vitamin D deficiency     Past Surgical History:  Procedure Laterality Date   COLONOSCOPY  2006   Dr Markham Jordan - Tubular adenoma   ESOPHAGOGASTRODUODENOSCOPY  9.22.06   Dr Elliot/Gastric polyp - hyperplastic    KNEE ARTHROSCOPY W/ MENISCAL REPAIR  2011   TUBAL LIGATION  1978   VAGINAL DELIVERY     x2    Prior to Admission medications   Medication Sig Start Date End Date Taking? Authorizing Provider  atorvastatin (LIPITOR) 10 MG tablet Take 10 mg by mouth daily.   Yes [provider]  Budeson-Glycopyrrol-Formoterol (BREZTRI AEROSPHERE) 160-9-4.8 MCG/ACT AERO Inhale 2 puffs into the lungs in the morning and at bedtime. 10/26/22  Yes Salena Saner, MD  cholecalciferol (VITAMIN D) 25 MCG (1000 UNIT) tablet Take 2,000 Units by mouth daily.   Yes [provider]  cyanocobalamin (VITAMIN B12) 1000 MCG tablet Take 5,000 mcg by mouth once a week.   Yes [provider]  esomeprazole (NEXIUM) 40 MG capsule Take 40 mg by mouth daily at 12 noon.   Yes [provider]  famotidine  (PEPCID) 20 MG tablet Take 20 mg by mouth 2 (two) times daily.   Yes [provider]  ibuprofen (ADVIL) 200 MG tablet Take 200 mg by mouth every 6 (six) hours as needed.   Yes [provider]  OXYGEN Inhale 2 L into the lungs at bedtime. And as needed   Yes [provider]  chlorpheniramine-HYDROcodone (TUSSIONEX) 10-8 MG/5ML Take by mouth. Take 5 mLs by mouth every 12 (twelve) hours as needed for Cough Patient not taking: Reported on 01/10/2023 05/31/22   [provider]  escitalopram (LEXAPRO) 5 MG tablet Take 5 mg by mouth as needed. Patient not taking: Reported on 01/10/2023 08/23/22   [provider]  Nintedanib (OFEV) 100 MG CAPS Take 1 capsule (100 mg total) by mouth 2 (two) times daily. Patient not taking: Reported on 07/20/2022 03/14/22   Salena Saner, MD    Allergies as of 12/18/2022 - Review Complete 09/27/2022  Allergen Reaction Noted   Tramadol Shortness Of Breath 08/04/2019   Penicillins  09/04/2011    Family History  Problem Relation Age of Onset   Hyperlipidemia Mother    Heart disease Mother    Stroke Mother    Hypertension Mother    Depression Mother    ALS Father    Heart disease Maternal Grandmother 75   Cancer Maternal Grandfather 46  Lung Ca   Heart attack Other    Early death Other        Due to Heart attacks, multiple cousins & uncles   Heart disease Other     Social History   Socioeconomic History   Marital status: Married    Spouse name: Not on file   Number of children: 2   Years of education: Not on file   Highest education level: Not on file  Occupational History   Occupation: Agricultural engineer - [employer]   Tobacco Use   Smoking status: Never   Smokeless tobacco: Never  Vaping Use   Vaping Use: Never used  Substance and Sexual Activity   Alcohol use: No   Drug use: No   Sexual activity: Not on file  Other Topics Concern   Not on file  Social History Narrative    Regular Exercise -  Walk 3 days a week x 30 min   Daily Caffeine Use:  1 coffee, 1 soda         Social Determinants of Health   Financial Resource Strain: Not on file  Food Insecurity: Not on file  Transportation Needs: Not on file  Physical Activity: Not on file  Stress: Not on file  Social Connections: Not on file  Intimate Partner Violence: Not on file    Review of Systems: See HPI, otherwise negative ROS  Physical Exam: BP (!) 101/52   Pulse 61   Temp (!) 97.2 F (36.2 C) (Temporal)   Resp 18   Ht 5\' 3"  (1.6 m)   Wt 54.4 kg   SpO2 100%   BMI 21.26 kg/m  General:   Alert,  pleasant and cooperative in NAD Head:  Normocephalic and atraumatic. Lungs:  Clear to auscultation.    Heart:  Regular rate and rhythm.   Impression/Plan: Cheyenne Wong is here for ophthalmic surgery.  Risks, benefits, limitations, and alternatives regarding ophthalmic surgery have been reviewed with the patient.  Questions have been answered.  All parties agreeable.   Lockie Mola, MD  01/16/2023, 9:15 AM

## 2023-01-16 NOTE — Anesthesia Postprocedure Evaluation (Signed)
Anesthesia Post Note  Patient: NAIOVY THUNE  Procedure(s) Performed: CATARACT EXTRACTION PHACO AND INTRAOCULAR LENS PLACEMENT (IOC) LEFT 9.09  00:49.1 (Left: Eye)  Patient location during evaluation: PACU Anesthesia Type: MAC Level of consciousness: awake and alert Pain management: pain level controlled Vital Signs Assessment: post-procedure vital signs reviewed and stable Respiratory status: spontaneous breathing, nonlabored ventilation, respiratory function stable and patient connected to nasal cannula oxygen Cardiovascular status: stable and blood pressure returned to baseline Postop Assessment: no apparent nausea or vomiting Anesthetic complications: no   No notable events documented.   Last Vitals:  Vitals:   01/16/23 1026 01/16/23 1030  BP:  (!) 104/56  Pulse: 69 64  Resp: 13 (!) 24  Temp:  (!) 36.1 C  SpO2: 98% 99%    Last Pain:  Vitals:   01/16/23 1030  TempSrc:   PainSc: 0-No pain                 Posey Petrik C Soul Hackman

## 2023-01-16 NOTE — Op Note (Signed)
  OPERATIVE NOTE  Cheyenne Wong [id number] 01/16/2023   PREOPERATIVE DIAGNOSIS:  Nuclear sclerotic cataract left eye. H25.12   POSTOPERATIVE DIAGNOSIS:    Nuclear sclerotic cataract left eye.     PROCEDURE:  Phacoemusification with posterior chamber intraocular lens placement of the left eye  Ultrasound time: Procedure(s): CATARACT EXTRACTION PHACO AND INTRAOCULAR LENS PLACEMENT (IOC) LEFT 9.09  00:49.1 (Left)  LENS:   Implant Name Type Inv. Item Serial No. Manufacturer Lot No. LRB No. Used Action  LENS IOL TECNIS EYHANCE 18.5 - [id number] Intraocular Lens LENS IOL TECNIS EYHANCE 18.5 [id number] [employer]  Left 1 Implanted      SURGEON:  Deirdre Evener, MD   ANESTHESIA:  Topical with tetracaine drops and 2% Xylocaine jelly, augmented with 1% preservative-free intracameral lidocaine.    COMPLICATIONS:  None.   DESCRIPTION OF PROCEDURE:  The patient was identified in the holding room and transported to the operating room and placed in the supine position under the operating microscope.  The left eye was identified as the operative eye and it was prepped and draped in the usual sterile ophthalmic fashion.   A 1 millimeter clear-corneal paracentesis was made at the 1:30 position.  0.5 ml of preservative-free 1% lidocaine was injected into the anterior chamber.  The anterior chamber was filled with Viscoat viscoelastic.  A 2.4 millimeter keratome was used to make a near-clear corneal incision at the 10:30 position.  .  A curvilinear capsulorrhexis was made with a cystotome and capsulorrhexis forceps.  Balanced salt solution was used to hydrodissect and hydrodelineate the nucleus.   Phacoemulsification was then used in stop and chop fashion to remove the lens nucleus and epinucleus.  The remaining cortex was then removed using the irrigation and aspiration handpiece. Provisc was then placed into the capsular bag to distend it for lens placement.  A lens was then injected into the  capsular bag.  The remaining viscoelastic was aspirated.   Wounds were hydrated with balanced salt solution.  The anterior chamber was inflated to a physiologic pressure with balanced salt solution.  No wound leaks were noted. Vigamox 0.2 ml of a 1mg  per ml solution was injected into the anterior chamber for a dose of 0.2 mg of intracameral antibiotic at the completion of the case.   Timolol and Brimonidine drops were applied to the eye.  The patient was taken to the recovery room in stable condition without complications of anesthesia or surgery.  [name] 01/16/2023, 10:24 AM

## 2023-01-21 ENCOUNTER — Encounter: Payer: Self-pay | Admitting: Pulmonary Disease

## 2023-01-23 NOTE — Telephone Encounter (Signed)
I spoke with [employer] trying to find out what they needed. They stated there were no notes on the account since 12/2021.  I have faxed our notes from 09/27/22 to [employer]. The patient only has 02 for night time use.   [employer] stated that her account is on hold.

## 2023-01-23 NOTE — Telephone Encounter (Signed)
Ms. Kinsel,  I will forward this message to our patient care coordinator, Synetta Fail. She is more familiar with the DME companies and what they have to offer.   We are happy to help. Have a great day.

## 2023-01-24 NOTE — Anesthesia Preprocedure Evaluation (Addendum)
Anesthesia Evaluation  Patient identified by MRN, date of birth, ID band Patient awake    Reviewed: Allergy & Precautions, H&P , NPO status , Patient's Chart, lab work & pertinent test results  Airway Mallampati: III  TM Distance: <3 FB    Comment:        Comment: Very short TMD.  Patient aware she may not be very sedated. Must be able to keep pulse oximetry at safe levels. Wears home oxygen 2 L/m/n/c at night. Has severe GERD, took Nexium today. Coughs often, instructed to please notify CRNA if she needs to cough.  Will try to decrease this chance but no guarantees  Dental   Pulmonary neg pulmonary ROS, shortness of breath   Comment: Very short TMD.  Patient aware she may not be very sedated. Must be able to keep pulse oximetry at safe levels. Wears home oxygen 2 L/m/n/c at night. Has severe GERD, took Nexium today. Coughs often, instructed to please notify CRNA if she needs to cough.  Will try to decrease this chance but no guarantees  10-26-21 IMPRESSION: 1. Moderate pulmonary fibrosis in a pattern with apical to basal gradient, featuring irregular peripheral interstitial opacity, septal thickening, areas of subpleural bronchiolectasis, and more clear evidence of honeycombing. Fibrotic findings are slightly worsened in comparison to prior examination dated 01/11/2020 as well as over a longer period of time dating back to 03/10/2014.   Findings are consistent with UIP per consensus guidelines: Diagnosis of Idiopathic Pulmonary Fibrosis: An Official ATS/ERS/JRS/ALAT Clinical Practice Guideline. Am Rosezetta Schlatter Crit Care Med Vol 198, Iss 5, [phone], Mar 16 2017. 2. Aortic atherosclerosis.    mild airway reactivity noted on PFTs             Cardiovascular negative cardio ROS   10/25/21    1. Left ventricular ejection fraction, by estimation, is 60 to 65%. The  left ventricle has normal function. The left ventricle has no regional   wall motion abnormalities. Left ventricular diastolic parameters are  consistent with Grade I diastolic  dysfunction (impaired relaxation).   2. Right ventricular systolic function is normal. The right ventricular  size is normal. There is normal pulmonary artery systolic pressure. The  estimated right ventricular systolic pressure is 12.2 mmHg.   3. The mitral valve is normal in structure. Mild mitral valve  regurgitation. No evidence of mitral stenosis.   4. The aortic valve was not well visualized. Aortic valve regurgitation  is mild. No aortic stenosis is present.   5. The inferior vena cava is normal in size with greater than 50%  respiratory variability, suggesting right atrial pressure of 3 mmHg.    Comparison(s): Stress echo performed at outside facility: LVEF >55%.  Today, patient reports chest soreness "from my coughing." Discomfort is worsened by deep breathing, but not alleviated by rest. Onset "all week." Denies n/v.  Denies radiation of discomfort.  Skin warm and dry.   Neuro/Psych  PSYCHIATRIC DISORDERS Anxiety     Panic attacksnegative neurological ROS  negative psych ROS   GI/Hepatic negative GI ROS, Neg liver ROS,GERD  ,,Severe GERD    Endo/Other  negative endocrine ROSHypothyroidism    Renal/GU negative Renal ROS  negative genitourinary   Musculoskeletal negative musculoskeletal ROS (+)    Abdominal   Peds negative pediatric ROS (+)  Hematology negative hematology ROS (+)   Anesthesia Other Findings Panic disorder           Vitamin D deficiency GERD (gastroesophageal reflux disease) Right knee meniscal tear  UIP (usual interstitial pneumonitis)  Hyperparathyroidism (HCC) Pulmonary fibrosis (HCC)             Chronic cough Dyspnea             Tremor Grade I diastolic dysfunction Motion sickness Mild aortic regurgitation      Mild mitral regurgitation by prior echocardiogram Grade I diastolic dysfunction  Mild tricuspid regurgitation by prior  echocardiogram Chronic cough Nocturnal hypoxemia Aortic atherosclerosis (HCC)            Polyarthralgia Dependence on nocturnal oxygen therapy History of chronic rhinitis   Previous cataract surgery 01-20-23 States anesthesia meds made her feel "very fatigued" for 2 days postop--and this will likely happen again    Reproductive/Obstetrics negative OB ROS                             Anesthesia Physical Anesthesia Plan  ASA: 3  Anesthesia Plan: MAC   Post-op Pain Management:    Induction: Intravenous  PONV Risk Score and Plan:   Airway Management Planned: Natural Airway and Nasal Cannula  Additional Equipment:   Intra-op Plan:   Post-operative Plan:   Informed Consent: I have reviewed the patients History and Physical, chart, labs and discussed the procedure including the risks, benefits and alternatives for the proposed anesthesia with the patient or authorized representative who has indicated his/her understanding and acceptance.     Dental Advisory Given  Plan Discussed with: Anesthesiologist, CRNA and Surgeon  Anesthesia Plan Comments: (Patient consented for risks of anesthesia including but not limited to:  - adverse reactions to medications - damage to eyes, teeth, lips or other oral mucosa - nerve damage due to positioning  - sore throat or hoarseness - Damage to heart, brain, nerves, lungs, other parts of body or loss of life  Patient voiced understanding.)        Anesthesia Quick Evaluation

## 2023-01-28 NOTE — Discharge Instructions (Signed)

## 2023-01-29 NOTE — Telephone Encounter (Signed)
I just spoke with [place] with [employer] and she confirmed that they did get the note from 09/27/22 and they will need a new Rx for the patient's 02. Ali Lowe will be faxing in the form to our office now

## 2023-01-30 ENCOUNTER — Encounter
Admission: RE | Disposition: A | Discharge status: Home / Self Care | Payer: Self-pay | Source: Ambulatory Visit | Attending: Ophthalmology

## 2023-01-30 ENCOUNTER — Ambulatory Visit: Payer: Medicare Other | Admitting: Anesthesiology

## 2023-01-30 ENCOUNTER — Ambulatory Visit
Admission: RE | Admit: 2023-01-30 | Discharge: 2023-01-30 | Disposition: A | Discharge status: Home / Self Care | Payer: Medicare Other | Source: Ambulatory Visit | Attending: Ophthalmology | Admitting: Ophthalmology

## 2023-01-30 ENCOUNTER — Other Ambulatory Visit: Payer: Self-pay

## 2023-01-30 DIAGNOSIS — G4736 Sleep related hypoventilation in conditions classified elsewhere: Secondary | ICD-10-CM | POA: Diagnosis not present

## 2023-01-30 DIAGNOSIS — I700 Atherosclerosis of aorta: Secondary | ICD-10-CM | POA: Diagnosis not present

## 2023-01-30 DIAGNOSIS — E559 Vitamin D deficiency, unspecified: Secondary | ICD-10-CM | POA: Diagnosis not present

## 2023-01-30 DIAGNOSIS — K219 Gastro-esophageal reflux disease without esophagitis: Secondary | ICD-10-CM | POA: Diagnosis not present

## 2023-01-30 DIAGNOSIS — Z9981 Dependence on supplemental oxygen: Secondary | ICD-10-CM | POA: Insufficient documentation

## 2023-01-30 DIAGNOSIS — H2511 Age-related nuclear cataract, right eye: Secondary | ICD-10-CM | POA: Insufficient documentation

## 2023-01-30 DIAGNOSIS — M2550 Pain in unspecified joint: Secondary | ICD-10-CM | POA: Diagnosis not present

## 2023-01-30 DIAGNOSIS — Z9842 Cataract extraction status, left eye: Secondary | ICD-10-CM | POA: Insufficient documentation

## 2023-01-30 DIAGNOSIS — Z79899 Other long term (current) drug therapy: Secondary | ICD-10-CM | POA: Diagnosis not present

## 2023-01-30 DIAGNOSIS — F410 Panic disorder [episodic paroxysmal anxiety] without agoraphobia: Secondary | ICD-10-CM | POA: Insufficient documentation

## 2023-01-30 DIAGNOSIS — J84112 Idiopathic pulmonary fibrosis: Secondary | ICD-10-CM | POA: Insufficient documentation

## 2023-01-30 DIAGNOSIS — Z961 Presence of intraocular lens: Secondary | ICD-10-CM | POA: Insufficient documentation

## 2023-01-30 HISTORY — PX: CATARACT EXTRACTION W/PHACO: SHX586

## 2023-01-30 SURGERY — PHACOEMULSIFICATION, CATARACT, WITH IOL INSERTION
Anesthesia: Monitor Anesthesia Care | Laterality: Right

## 2023-01-30 MED ORDER — SIGHTPATH DOSE#1 NA HYALUR & NA CHOND-NA HYALUR IO KIT
PACK | INTRAOCULAR | Status: DC | PRN
  Administered 2023-01-30: 1 via OPHTHALMIC

## 2023-01-30 MED ORDER — MIDAZOLAM HCL 2 MG/2ML IJ SOLN
INTRAMUSCULAR | Status: DC | PRN
  Administered 2023-01-30: 2 mg via INTRAVENOUS

## 2023-01-30 MED ORDER — BRIMONIDINE TARTRATE-TIMOLOL 0.2-0.5 % OP SOLN
OPHTHALMIC | Status: DC | PRN
  Administered 2023-01-30: 1 [drp] via OPHTHALMIC

## 2023-01-30 MED ORDER — LACTATED RINGERS IV SOLN: INTRAVENOUS | Status: DC

## 2023-01-30 MED ORDER — TETRACAINE HCL 0.5 % OP SOLN
1.0000 [drp] | OPHTHALMIC | Status: DC | PRN
  Administered 2023-01-30 (×3): 1 [drp] via OPHTHALMIC

## 2023-01-30 MED ORDER — SIGHTPATH DOSE#1 BSS IO SOLN
INTRAOCULAR | Status: DC | PRN
  Administered 2023-01-30: 15 mL via INTRAOCULAR

## 2023-01-30 MED ORDER — SIGHTPATH DOSE#1 BSS IO SOLN
INTRAOCULAR | Status: DC | PRN
  Administered 2023-01-30: 2 mL

## 2023-01-30 MED ORDER — MOXIFLOXACIN HCL 0.5 % OP SOLN
OPHTHALMIC | Status: DC | PRN
  Administered 2023-01-30: .2 mL via OPHTHALMIC

## 2023-01-30 MED ORDER — ARMC OPHTHALMIC DILATING DROPS
1.0000 | OPHTHALMIC | Status: DC | PRN
  Administered 2023-01-30 (×3): 1 via OPHTHALMIC

## 2023-01-30 MED ORDER — SIGHTPATH DOSE#1 BSS IO SOLN
INTRAOCULAR | Status: DC | PRN
  Administered 2023-01-30: 56 mL via OPHTHALMIC

## 2023-01-30 SURGICAL SUPPLY — 9 items
CATARACT SUITE SIGHTPATH (MISCELLANEOUS) ×1 IMPLANT
FEE CATARACT SUITE SIGHTPATH (MISCELLANEOUS) ×1 IMPLANT
GLOVE SRG 8 PF TXTR STRL LF DI (GLOVE) ×1 IMPLANT
GLOVE SURG ENC TEXT LTX SZ7.5 (GLOVE) ×1 IMPLANT
GLOVE SURG UNDER POLY LF SZ8 (GLOVE) ×1
LENS IOL TECNIS EYHANCE 19.5 (Intraocular Lens) IMPLANT
NDL FILTER BLUNT 18X1 1/2 (NEEDLE) ×1 IMPLANT
NEEDLE FILTER BLUNT 18X1 1/2 (NEEDLE) ×1 IMPLANT
SYR 3ML LL SCALE MARK (SYRINGE) ×1 IMPLANT

## 2023-01-30 NOTE — Op Note (Signed)
LOCATION:  [facility]   PREOPERATIVE DIAGNOSIS:    Nuclear sclerotic cataract right eye. H25.11   POSTOPERATIVE DIAGNOSIS:  Nuclear sclerotic cataract right eye.     PROCEDURE:  Phacoemusification with posterior chamber intraocular lens placement of the right eye   ULTRASOUND TIME: Procedure(s): CATARACT EXTRACTION PHACO AND INTRAOCULAR LENS PLACEMENT (IOC) RIGHT 6.60 00:39.1 (Right)  LENS:   Implant Name Type Inv. Item Serial No. Manufacturer Lot No. LRB No. Used Action  LENS IOL TECNIS EYHANCE 19.5 - [id number] Intraocular Lens LENS IOL TECNIS EYHANCE 19.5 [id number] [employer]  Right 1 Implanted         SURGEON:  Deirdre Evener, MD   ANESTHESIA:  Topical with tetracaine drops and 2% Xylocaine jelly, augmented with 1% preservative-free intracameral lidocaine.    COMPLICATIONS:  None.   DESCRIPTION OF PROCEDURE:  The patient was identified in the holding room and transported to the operating room and placed in the supine position under the operating microscope.  The right eye was identified as the operative eye and it was prepped and draped in the usual sterile ophthalmic fashion.   A 1 millimeter clear-corneal paracentesis was made at the 12:00 position.  0.5 ml of preservative-free 1% lidocaine was injected into the anterior chamber. The anterior chamber was filled with Viscoat viscoelastic.  A 2.4 millimeter keratome was used to make a near-clear corneal incision at the 9:00 position.  A curvilinear capsulorrhexis was made with a cystotome and capsulorrhexis forceps.  Balanced salt solution was used to hydrodissect and hydrodelineate the nucleus.   Phacoemulsification was then used in stop and chop fashion to remove the lens nucleus and epinucleus.  The remaining cortex was then removed using the irrigation and aspiration handpiece. Provisc was then placed into the capsular bag to distend it for lens placement.  A lens was then injected into the capsular bag.  The  remaining viscoelastic was aspirated.   Wounds were hydrated with balanced salt solution.  The anterior chamber was inflated to a physiologic pressure with balanced salt solution.  No wound leaks were noted. Vigamox 0.2 ml of a 1mg  per ml solution was injected into the anterior chamber for a dose of 0.2 mg of intracameral antibiotic at the completion of the case.   Timolol and Brimonidine drops were applied to the eye.  The patient was taken to the recovery room in stable condition without complications of anesthesia or surgery.   Eduard Penkala 01/30/2023, 12:55 PM

## 2023-01-30 NOTE — Anesthesia Postprocedure Evaluation (Signed)
Anesthesia Post Note  Patient: Cheyenne Wong  Procedure(s) Performed: CATARACT EXTRACTION PHACO AND INTRAOCULAR LENS PLACEMENT (IOC) RIGHT 6.60 00:39.1 (Right)  Patient location during evaluation: PACU Anesthesia Type: MAC Level of consciousness: awake and alert Pain management: pain level controlled Vital Signs Assessment: post-procedure vital signs reviewed and stable Respiratory status: spontaneous breathing, nonlabored ventilation, respiratory function stable and patient connected to nasal cannula oxygen Cardiovascular status: stable and blood pressure returned to baseline Postop Assessment: no apparent nausea or vomiting Anesthetic complications: no   No notable events documented.   Last Vitals:  Vitals:   01/30/23 1301 01/30/23 1303  BP: (!) 109/90 (!) 109/56  Pulse: 81 73  Resp: 18 (!) 21  Temp:    SpO2: 100% 100%    Last Pain:  Vitals:   01/30/23 1306  TempSrc:   PainSc: 0-No pain                 Owenn Rothermel C Adams Hinch

## 2023-01-30 NOTE — Transfer of Care (Signed)
Immediate Anesthesia Transfer of Care Note  Patient: Cheyenne Wong  Procedure(s) Performed: CATARACT EXTRACTION PHACO AND INTRAOCULAR LENS PLACEMENT (IOC) RIGHT 6.60 00:39.1 (Right)  Patient Location: PACU  Anesthesia Type: MAC  Level of Consciousness: awake, alert  and patient cooperative  Airway and Oxygen Therapy: Patient Spontanous Breathing and Patient connected to supplemental oxygen  Post-op Assessment: Post-op Vital signs reviewed, Patient's Cardiovascular Status Stable, Respiratory Function Stable, Patent Airway and No signs of Nausea or vomiting  Post-op Vital Signs: Reviewed and stable  Complications: No notable events documented.

## 2023-01-30 NOTE — H&P (Signed)
[facility]   Primary Care Physician:  Lynnea Ferrier, MD Ophthalmologist: Dr. Lockie Mola  Pre-Procedure History & Physical: HPI:  Cheyenne Wong is a 72 y.o. female here for ophthalmic surgery.   Past Medical History:  Diagnosis Date   Aortic atherosclerosis (HCC)    Chronic cough    Chronic cough    Dependence on nocturnal oxygen therapy    Dyspnea    from Pulmonary fibrosis   GERD (gastroesophageal reflux disease)    Grade I diastolic dysfunction    History of chronic rhinitis    Hyperparathyroidism (HCC)    Mild aortic regurgitation    Mild mitral regurgitation by prior echocardiogram    Mild tricuspid regurgitation by prior echocardiogram    Motion sickness    back seat of car   Nocturnal hypoxemia    Panic disorder    Polyarthralgia    Pulmonary fibrosis (HCC)    Right knee meniscal tear    UIP (usual interstitial pneumonitis) (HCC)    Vitamin D deficiency     Past Surgical History:  Procedure Laterality Date   CATARACT EXTRACTION W/PHACO Left 01/16/2023   Procedure: CATARACT EXTRACTION PHACO AND INTRAOCULAR LENS PLACEMENT (IOC) LEFT 9.09  00:49.1;  Surgeon: Lockie Mola, MD;  Location: [facility];  Service: Ophthalmology;  Laterality: Left;   COLONOSCOPY  2006   Dr Markham Jordan - Tubular adenoma   ESOPHAGOGASTRODUODENOSCOPY  9.22.06   Dr Elliot/Gastric polyp - hyperplastic    KNEE ARTHROSCOPY W/ MENISCAL REPAIR  2011   TUBAL LIGATION  1978   VAGINAL DELIVERY     x2    Prior to Admission medications   Medication Sig Start Date End Date Taking? Authorizing Provider  atorvastatin (LIPITOR) 10 MG tablet Take 10 mg by mouth daily.   Yes [provider]  Budeson-Glycopyrrol-Formoterol (BREZTRI AEROSPHERE) 160-9-4.8 MCG/ACT AERO Inhale 2 puffs into the lungs in the morning and at bedtime. 10/26/22  Yes Salena Saner, MD  chlorpheniramine-HYDROcodone (TUSSIONEX) 10-8 MG/5ML Take by mouth. Take 5 mLs by mouth every 12  (twelve) hours as needed for Cough 05/31/22  Yes [provider]  cholecalciferol (VITAMIN D) 25 MCG (1000 UNIT) tablet Take 2,000 Units by mouth daily.   Yes [provider]  cyanocobalamin (VITAMIN B12) 1000 MCG tablet Take 5,000 mcg by mouth once a week.   Yes [provider]  escitalopram (LEXAPRO) 5 MG tablet Take 5 mg by mouth as needed. 08/23/22  Yes [provider]  esomeprazole (NEXIUM) 40 MG capsule Take 40 mg by mouth daily at 12 noon.   Yes [provider]  famotidine (PEPCID) 20 MG tablet Take 20 mg by mouth 2 (two) times daily.   Yes [provider]  ibuprofen (ADVIL) 200 MG tablet Take 200 mg by mouth every 6 (six) hours as needed.   Yes [provider]  Nintedanib (OFEV) 100 MG CAPS Take 1 capsule (100 mg total) by mouth 2 (two) times daily. 03/14/22  Yes Salena Saner, MD  OXYGEN Inhale 2 L into the lungs at bedtime. And as needed   Yes [provider]    Allergies as of 12/18/2022 - Review Complete 09/27/2022  Allergen Reaction Noted   Tramadol Shortness Of Breath 08/04/2019   Penicillins  09/04/2011    Family History  Problem Relation Age of Onset   Hyperlipidemia Mother    Heart disease Mother    Stroke Mother    Hypertension Mother    Depression Mother  ALS Father    Heart disease Maternal Grandmother 24   Cancer Maternal Grandfather 28       Lung Ca   Heart attack Other    Early death Other        Due to Heart attacks, multiple cousins & uncles   Heart disease Other     Social History   Socioeconomic History   Marital status: Married    Spouse name: Not on file   Number of children: 2   Years of education: Not on file   Highest education level: Not on file  Occupational History   Occupation: Agricultural engineer - [employer]   Tobacco Use   Smoking status: Never   Smokeless tobacco: Never  Vaping Use   Vaping status: Never Used  Substance and Sexual  Activity   Alcohol use: No   Drug use: No   Sexual activity: Not on file  Other Topics Concern   Not on file  Social History Narrative   Regular Exercise -  Walk 3 days a week x 30 min   Daily Caffeine Use:  1 coffee, 1 soda         Social Determinants of Health   Financial Resource Strain: Not on file  Food Insecurity: Not on file  Transportation Needs: Not on file  Physical Activity: Not on file  Stress: Not on file  Social Connections: Not on file  Intimate Partner Violence: Not on file    Review of Systems: See HPI, otherwise negative ROS  Physical Exam: BP (!) 130/53   Pulse 60   Temp 98.2 F (36.8 C) (Temporal)   Wt 54.4 kg   SpO2 100%   BMI 21.26 kg/m  General:   Alert,  pleasant and cooperative in NAD Head:  Normocephalic and atraumatic. Lungs:  Clear to auscultation.    Heart:  Regular rate and rhythm.   Impression/Plan: Cheyenne Wong is here for ophthalmic surgery.  Risks, benefits, limitations, and alternatives regarding ophthalmic surgery have been reviewed with the patient.  Questions have been answered.  All parties agreeable.   Lockie Mola, MD  01/30/2023, 11:48 AM

## 2023-01-31 ENCOUNTER — Encounter: Payer: Self-pay | Admitting: Ophthalmology

## 2023-02-04 ENCOUNTER — Telehealth: Payer: Self-pay | Admitting: Pulmonary Disease

## 2023-02-04 NOTE — Telephone Encounter (Signed)
Spoke to Cheyenne Wong with [employer] and verified date of 02/01/2023, as written on Rx in scan folder. Nothing further needed.

## 2023-02-04 NOTE — Telephone Encounter (Signed)
Nivea from [employer]. They rec'd the Standard Order for O2 but can not read all of it;namely the date of 12/7/?. The handwriting is the issue, not the fax quality. That is all that needs correcting she said.   Chauncey Reading is @ [phone]

## 2023-02-12 ENCOUNTER — Encounter: Payer: Self-pay | Admitting: Pulmonary Disease

## 2023-02-12 ENCOUNTER — Ambulatory Visit (INDEPENDENT_AMBULATORY_CARE_PROVIDER_SITE_OTHER): Payer: Medicare Other | Admitting: Pulmonary Disease

## 2023-02-12 ENCOUNTER — Telehealth: Payer: Self-pay

## 2023-02-12 VITALS — BP 118/78 | HR 82 | Temp 97.7°F | Ht 63.0 in | Wt 124.0 lb

## 2023-02-12 DIAGNOSIS — G4734 Idiopathic sleep related nonobstructive alveolar hypoventilation: Secondary | ICD-10-CM | POA: Diagnosis not present

## 2023-02-12 DIAGNOSIS — R053 Chronic cough: Secondary | ICD-10-CM

## 2023-02-12 DIAGNOSIS — J84112 Idiopathic pulmonary fibrosis: Secondary | ICD-10-CM | POA: Diagnosis not present

## 2023-02-12 DIAGNOSIS — K219 Gastro-esophageal reflux disease without esophagitis: Secondary | ICD-10-CM

## 2023-02-12 DIAGNOSIS — R0602 Shortness of breath: Secondary | ICD-10-CM

## 2023-02-12 MED ORDER — METHYLPREDNISOLONE 4 MG PO TBPK: ORAL_TABLET | ORAL | 0 refills | Status: DC

## 2023-02-12 MED ORDER — BREZTRI AEROSPHERE 160-9-4.8 MCG/ACT IN AERO: 2.0000 | INHALATION_SPRAY | Freq: Two times a day (BID) | RESPIRATORY_TRACT | Status: DC

## 2023-02-12 NOTE — Patient Instructions (Signed)
Continue prescription for some prednisone like medication to the pharmacy.  Your oxygen levels stayed up during your walk today.  We have scheduled another scan of the chest as well as breathing tests and a 6-minute walk.  We will see him in follow-up in 3 to 4 weeks time with either me or the nurse practitioner at that time.

## 2023-02-12 NOTE — Telephone Encounter (Signed)
Patient was seen in the office today. She was given the [employer] assistance form to fill out and bring back. She will return it when she come for her next office visit on 03/26/2023.

## 2023-02-12 NOTE — Progress Notes (Unsigned)
Subjective:    Patient ID: Cheyenne Wong, female    DOB: Sep 17, 1950, 72 y.o.   MRN: [id number]  Patient Care Team: Lynnea Ferrier, MD as PCP - General (Internal Medicine) Salena Saner, MD as Consulting Physician (Pulmonary Disease)  Chief Complaint  Patient presents with  . Follow-up    Really tired. Increased SOB. Dry cough. No wheezing.     HPI    Review of Systems A 10 point review of systems was performed and it is as noted above otherwise negative.   Patient Active Problem List   Diagnosis Date Noted  . GERD (gastroesophageal reflux disease) 11/16/2021  . Prediabetes 07/29/2019  . Other dysphagia 04/01/2017  . Pulmonary fibrosis (HCC) 10/15/2016  . Need for hepatitis C screening test 06/02/2015  . Screening for breast cancer 04/29/2015  . Hypothyroidism 08/26/2012  . Vitamin D deficiency 10/02/2011  . Hyperlipidemia 09/04/2011    Social History   Tobacco Use  . Smoking status: Never  . Smokeless tobacco: Never  Substance Use Topics  . Alcohol use: No    Allergies  Allergen Reactions  . Peanut Butter Flavor Shortness Of Breath    Peanut Butter  . Tramadol Shortness Of Breath  . Walnut Shortness Of Breath  . Penicillins Rash    Allergy as a child    Current Meds  Medication Sig  . atorvastatin (LIPITOR) 10 MG tablet Take 10 mg by mouth daily.  . Budeson-Glycopyrrol-Formoterol (BREZTRI AEROSPHERE) 160-9-4.8 MCG/ACT AERO Inhale 2 puffs into the lungs in the morning and at bedtime.  . chlorpheniramine-HYDROcodone (TUSSIONEX) 10-8 MG/5ML Take by mouth. Take 5 mLs by mouth every 12 (twelve) hours as needed for Cough  . cholecalciferol (VITAMIN D) 25 MCG (1000 UNIT) tablet Take 2,000 Units by mouth daily.  . cyanocobalamin (VITAMIN B12) 1000 MCG tablet Take 5,000 mcg by mouth once a week.  . esomeprazole (NEXIUM) 40 MG capsule Take 40 mg by mouth daily at 12 noon.  . famotidine (PEPCID) 20 MG tablet Take 20 mg by mouth 2 (two) times daily.  Marland Kitchen  ibuprofen (ADVIL) 200 MG tablet Take 200 mg by mouth every 6 (six) hours as needed.  . OXYGEN Inhale 2 L into the lungs at bedtime. And as needed    Immunization History  Administered Date(s) Administered  . Influenza Inj Mdck Quad Pf 05/08/2018, 05/25/2019, 06/16/2020, 06/02/2021  . Influenza Split 05/04/2011, 05/19/2012  . Influenza,inj,Quad PF,6+ Mos 03/31/2013, 06/02/2015  . Influenza,inj,quad, With Preservative 04/15/2016  . Influenza-Unspecified 06/03/2017  . Janssen (J&J) SARS-COV-2 Vaccination 10/23/2019  . Moderna SARS-COV2 Booster Vaccination 06/24/2022  . PFIZER Comirnaty(Gray Top)Covid-19 Tri-Sucrose Vaccine 05/23/2020  . Pneumococcal Conjugate-13 06/20/2018  . Pneumococcal Polysaccharide-23 07/16/2013, 07/29/2019  . Tdap 07/16/2008, 07/28/2018        Objective:     BP 118/78 (BP Location: Left Arm, Cuff Size: Normal)   Pulse 82   Temp 97.7 F (36.5 C)   Ht 5\' 3"  (1.6 m)   Wt 124 lb (56.2 kg)   SpO2 100%   BMI 21.97 kg/m   SpO2: 100 % O2 Device: None (Room air)  GENERAL: HEAD: Normocephalic, atraumatic.  EYES: Pupils equal, round, reactive to light.  No scleral icterus.  MOUTH:  NECK: Supple. No thyromegaly. Trachea midline. No JVD.  No adenopathy. PULMONARY: Good air entry bilaterally.  No adventitious sounds. CARDIOVASCULAR: S1 and S2. Regular rate and rhythm.  ABDOMEN: MUSCULOSKELETAL: No joint deformity, no clubbing, no edema.  NEUROLOGIC:  SKIN: Intact,warm,dry. PSYCH:  Assessment & Plan:   No diagnosis found.  No orders of the defined types were placed in this encounter.   No orders of the defined types were placed in this encounter.     Gailen Shelter, MD Advanced Bronchoscopy PCCM [facility]    *This note was dictated using voice recognition software/Dragon.  Despite best efforts to proofread, errors can occur which can change the meaning. Any transcriptional errors that result from this process are  unintentional and may not be fully corrected at the time of dictation.

## 2023-02-14 ENCOUNTER — Encounter: Payer: Self-pay | Admitting: Pulmonary Disease

## 2023-02-18 ENCOUNTER — Encounter: Payer: Self-pay | Admitting: Pulmonary Disease

## 2023-02-19 ENCOUNTER — Ambulatory Visit
Admission: RE | Admit: 2023-02-19 | Discharge: 2023-02-19 | Disposition: A | Discharge status: Home / Self Care | Payer: Medicare Other | Source: Ambulatory Visit | Attending: Internal Medicine | Admitting: Internal Medicine

## 2023-02-19 ENCOUNTER — Ambulatory Visit: Payer: Medicare Other | Attending: Pulmonary Disease

## 2023-02-19 DIAGNOSIS — R053 Chronic cough: Secondary | ICD-10-CM | POA: Diagnosis present

## 2023-02-19 DIAGNOSIS — R0602 Shortness of breath: Secondary | ICD-10-CM | POA: Insufficient documentation

## 2023-02-19 DIAGNOSIS — J84112 Idiopathic pulmonary fibrosis: Secondary | ICD-10-CM | POA: Insufficient documentation

## 2023-02-19 LAB — PULMONARY FUNCTION TEST ARMC ONLY
DL/VA % pred: 102 %
DL/VA: 4.29 ml/min/mmHg/L
DLCO unc % pred: 64 %
DLCO unc: 12.11 ml/min/mmHg
FEF 25-75 Post: 1.3 L/sec
FEF 25-75 Pre: 1.12 L/sec
FEF2575-%Change-Post: 15 %
FEF2575-%Pred-Post: 68 %
FEF2575-%Pred-Pre: 59 %
FEV1-%Change-Post: 3 %
FEV1-%Pred-Post: 63 %
FEV1-%Pred-Pre: 61 %
FEV1-Post: 1.4 L
FEV1-Pre: 1.35 L
FEV1FVC-%Change-Post: 1 %
FEV1FVC-%Pred-Pre: 99 %
FEV6-%Change-Post: 2 %
FEV6-%Pred-Post: 65 %
FEV6-%Pred-Pre: 63 %
FEV6-Post: 1.82 L
FEV6-Pre: 1.78 L
FEV6FVC-%Pred-Post: 104 %
FEV6FVC-%Pred-Pre: 104 %
FVC-%Change-Post: 2 %
FVC-%Pred-Post: 62 %
FVC-%Pred-Pre: 61 %
FVC-Post: 1.82 L
FVC-Pre: 1.78 L
Post FEV1/FVC ratio: 77 %
Post FEV6/FVC ratio: 100 %
Pre FEV1/FVC ratio: 76 %
Pre FEV6/FVC Ratio: 100 %
RV % pred: 75 %
RV: 1.6 L
TLC % pred: 68 %
TLC: 3.35 L

## 2023-02-19 MED ORDER — ALBUTEROL SULFATE (2.5 MG/3ML) 0.083% IN NEBU
2.5000 mg | INHALATION_SOLUTION | Freq: Once | RESPIRATORY_TRACT | Status: AC
  Administered 2023-02-19: 2.5 mg via RESPIRATORY_TRACT
  Filled 2023-02-19: qty 3

## 2023-02-20 ENCOUNTER — Telehealth: Payer: Self-pay | Admitting: Pulmonary Disease

## 2023-02-20 NOTE — Telephone Encounter (Signed)
Lm x1 for update.

## 2023-02-20 NOTE — Telephone Encounter (Signed)
Patient is aware that CT has not been read by radiology. She is aware that our office will contact her once report has been read.  Nothing further needed.

## 2023-02-20 NOTE — Telephone Encounter (Signed)
Patient calling back for results from imaging. Please advise

## 2023-02-20 NOTE — Telephone Encounter (Signed)
Spoke to patient. She stated that form is completed and she will bring it at her next appt.

## 2023-03-22 ENCOUNTER — Telehealth: Payer: Self-pay | Admitting: Pulmonary Disease

## 2023-03-22 NOTE — Telephone Encounter (Signed)
Please see 03/22/23 phone note.

## 2023-03-22 NOTE — Telephone Encounter (Signed)
Patient dropped off patient assistance form for [facility]. Put in Dr. Jayme Cloud folder. Patient phone number is [phone].

## 2023-03-22 NOTE — Telephone Encounter (Signed)
Form has been signed and faxed.  Nothing further needed.

## 2023-03-26 ENCOUNTER — Encounter: Payer: Self-pay | Admitting: Pulmonary Disease

## 2023-03-26 ENCOUNTER — Ambulatory Visit (INDEPENDENT_AMBULATORY_CARE_PROVIDER_SITE_OTHER): Payer: Medicare Other | Admitting: Pulmonary Disease

## 2023-03-26 VITALS — BP 118/80 | HR 69 | Temp 97.4°F | Ht 63.0 in | Wt 124.0 lb

## 2023-03-26 DIAGNOSIS — J84112 Idiopathic pulmonary fibrosis: Secondary | ICD-10-CM | POA: Diagnosis not present

## 2023-03-26 DIAGNOSIS — G4734 Idiopathic sleep related nonobstructive alveolar hypoventilation: Secondary | ICD-10-CM | POA: Diagnosis not present

## 2023-03-26 DIAGNOSIS — R053 Chronic cough: Secondary | ICD-10-CM | POA: Diagnosis not present

## 2023-03-26 DIAGNOSIS — R0602 Shortness of breath: Secondary | ICD-10-CM | POA: Diagnosis not present

## 2023-03-26 NOTE — Progress Notes (Signed)
Subjective:    Patient ID: Cheyenne Wong, female    DOB: 08/26/1950, 72 y.o.   MRN: [id number]  Patient Care Team: Lynnea Ferrier, MD as PCP - General (Internal Medicine) Salena Saner, MD as Consulting Physician (Pulmonary Disease)  Chief Complaint  Patient presents with   Follow-up    SOB is better but still there. No wheezing. Dry cough.     HPI Cheyenne Wong is a 72 year old lifelong never smoker with UIP/pulmonary fibrosis and nocturnal hypoxemia who presents for follow-up. This is a scheduled visit.  Last visit here was on 12 February 2023, at that time she had requested with another trial of steroid taper which had helped her in the past.  She received a Medrol Dosepak however she never felt improvement that she had had previously.  In the interim, she has had repeat high-resolution chest CT and PFTs.  Her disease appears to be stable by these parameters. Continues to have issues with a dry cough, the cough has not worsened from baseline.  She does note that Breztri helps with the cough.  No wheezing. She also notes increasing fatigue. Also notes that nocturnal oxygen helps her sleep better.  Patient is compliant with nocturnal oxygen. Her reflux symptoms are controlled with Pepcid and antireflux measures. We discussed that her options are limited with regards to progression of disease, she is aware of this.    She has not had any recent fevers, chills or sweats.  No hemoptysis.  Has not had orthopnea, paroxysmal nocturnal dyspnea, chest pain, lower extremity edema or calf tenderness.  She does not endorse any other symptomatology.  She is now willing to revisit the possibility of pulmonary rehab which she has declined in the past.    THERAPIES tried and failed: Esbriet (pirfenidone) - d/c'd due to "side effects" + cost Ofev (nintendanib) - nausea,diarrhea, legs felt tight Has declined Pulmonary Rehab previously Declined research protocols  DATA 10/24/2021 overnight oximetry:  Qualified for O2 with significant desaturations overnight as low as 84%. 10/26/2021 CT chest high-resolution: Slightly worsened fibrotic changes and honeycombing from prior CT of 11 January 2020, findings consistent with UIP. 12/13/2021 PFTs: FEV1 1.50 L or 67% predicted, FVC 1.87 L or 63% predicted, FEV1/FVC 76%. Lung volumes moderately reduced, there is a small airways component.  Diffusion capacity moderately reduced.  Compared to PFTs performed at [facility] clinic in April 2022 there has been slight decline in function. 07/20/2022 chest x-ray: Redemonstration of fibrosis/interstitial lung disease with no definitive acute superimposed disease. 10/26/2021 high-resolution CT chest: moderate pulmonary fibrosis in a pattern apical to basal gradient.  Fibrotic findings slightly worsened in comparison to prior examination of 11 January 2020.  Findings consistent with UIP. 02/19/2023 CT chest, high-resolution: Stable fibrotic changes in the lungs, categorized as diagnostic of usual interstitial pneumonia (UIP). 02/19/2023 PFTs: FEV1 1.35 L or 61% of predicted, FVC 1.78 L or 61% predicted, FEV1/FVC 76%, lung volumes moderately reduced.  Mild diffusion defect.  Overall no significant change from prior.    Review of Systems A 10 point review of systems was performed and it is as noted above otherwise negative.   Patient Active Problem List   Diagnosis Date Noted   GERD (gastroesophageal reflux disease) 11/16/2021   Prediabetes 07/29/2019   Other dysphagia 04/01/2017   Pulmonary fibrosis (HCC) 10/15/2016   Need for hepatitis C screening test 06/02/2015   Screening for breast cancer 04/29/2015   Hypothyroidism 08/26/2012   Vitamin D deficiency 10/02/2011   Hyperlipidemia 09/04/2011  Social History   Tobacco Use   Smoking status: Never   Smokeless tobacco: Never  Substance Use Topics   Alcohol use: No    Allergies  Allergen Reactions   Peanut Butter Flavor Shortness Of Breath    Peanut Butter    Tramadol Shortness Of Breath   Walnut Shortness Of Breath   Penicillins Rash    Allergy as a child    Current Meds  Medication Sig   atorvastatin (LIPITOR) 10 MG tablet Take 10 mg by mouth daily.   Budeson-Glycopyrrol-Formoterol (BREZTRI AEROSPHERE) 160-9-4.8 MCG/ACT AERO Inhale 2 puffs into the lungs in the morning and at bedtime.   chlorpheniramine-HYDROcodone (TUSSIONEX) 10-8 MG/5ML Take by mouth. Take 5 mLs by mouth every 12 (twelve) hours as needed for Cough   cholecalciferol (VITAMIN D) 25 MCG (1000 UNIT) tablet Take 2,000 Units by mouth daily.   cyanocobalamin (VITAMIN B12) 1000 MCG tablet Take 5,000 mcg by mouth once a week.   esomeprazole (NEXIUM) 40 MG capsule Take 40 mg by mouth daily at 12 noon.   famotidine (PEPCID) 20 MG tablet Take 20 mg by mouth 2 (two) times daily.   ibuprofen (ADVIL) 200 MG tablet Take 200 mg by mouth every 6 (six) hours as needed.   methylPREDNISolone (MEDROL DOSEPAK) 4 MG TBPK tablet Take as directed in the package   OXYGEN Inhale 2 L into the lungs at bedtime. And as needed    Immunization History  Administered Date(s) Administered   Influenza Inj Mdck Quad Pf 05/08/2018, 05/25/2019, 06/16/2020, 06/02/2021   Influenza Split 05/04/2011, 05/19/2012   Influenza,inj,Quad PF,6+ Mos 03/31/2013, 06/02/2015   Influenza,inj,quad, With Preservative 04/15/2016   Influenza-Unspecified 06/03/2017   Janssen (J&J) SARS-COV-2 Vaccination 10/23/2019   Moderna SARS-COV2 Booster Vaccination 06/24/2022   PFIZER Comirnaty(Gray Top)Covid-19 Tri-Sucrose Vaccine 05/23/2020   Pneumococcal Conjugate-13 06/20/2018   Pneumococcal Polysaccharide-23 07/16/2013, 07/29/2019   Tdap 07/16/2008, 07/28/2018        Objective:     BP 118/80 (BP Location: Right Arm, Cuff Size: Normal)   Pulse 69   Temp (!) 97.4 F (36.3 C)   Ht 5\' 3"  (1.6 m)   Wt 124 lb (56.2 kg)   SpO2 99%   BMI 21.97 kg/m   SpO2: 99 % O2 Device: None (Room air)  GENERAL: Well-developed,  well-nourished woman, no acute distress.  Fully ambulatory, mild conversational dyspnea.  Mild tachypnea noted.  Occasionally will have dry cough. HEAD: Normocephalic, atraumatic.  EYES: Pupils equal, round, reactive to light.  No scleral icterus.  MOUTH: Oral mucosa moist.  No thrush, pharynx clear.  No angular cheilitis noted today. NECK: Supple. No thyromegaly. Trachea midline. No JVD.  No adenopathy. PULMONARY: Good air entry bilaterally.  No wheezes, faint pops and faint bibasilar crackles. CARDIOVASCULAR: S1 and S2. Regular rate and rhythm.  No rubs, murmurs or gallops heard. ABDOMEN: Benign. MUSCULOSKELETAL: No joint deformity, no clubbing, no edema.  NEUROLOGIC: No overt focal deficit, no gait disturbance, speech is fluent. SKIN: Intact,warm,dry. PSYCH: Mood and behavior normal.        Assessment & Plan:     ICD-10-CM   1. 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 SOB (shortness of breath)  R06.02 ECHOCARDIOGRAM COMPLETE    AMB referral to pulmonary rehabilitation   Referred to pulmonary rehab    3. Chronic cough  R05.3    Responds to  Breztri Continue Breztri    4. Nocturnal hypoxemia  G47.34    Patient compliant with oxygen supplementation Notes benefit of therapy Continue oxygen at 2 L/min nocturnally      Orders Placed This Encounter  Procedures   AMB referral to pulmonary rehabilitation    Referral Priority:   Routine    Referral Type:   Consultation    Referral Reason:   Specialty Services Required    Number of Visits Requested:   1   ECHOCARDIOGRAM COMPLETE    Standing Status:   Future    Standing Expiration Date:   03/25/2024    Order Specific Question:   Where should this test be performed    Answer:   [facility]    Order Specific Question:   Perflutren DEFINITY (image enhancing agent) should be  administered unless hypersensitivity or allergy exist    Answer:   Administer Perflutren    Order Specific Question:   Reason for exam-Echo    Answer:   Dyspnea  R06.00   Will see the patient in follow-up in 3 months time call sooner should any new problems arise.   Cheyenne Shelter, MD Advanced Bronchoscopy P[facility]    *This note was dictated using voice recognition software/Dragon.  Despite best efforts to proofread, errors can occur which can change the meaning. Any transcriptional errors that result from this process are unintentional and may not be fully corrected at the time of dictation.

## 2023-03-26 NOTE — Patient Instructions (Signed)
Your oxygen level maintained well during your test today.  Please try to use your oxygen at nighttime consistently.  We will send a referral to the pulmonary rehabilitation program, I think that this will help you.  Reassessing your heart test to make sure that the artery going from the heart to the lungs is not under high pressure.  Continue using your Breztri.  Over-the-counter in health food store or similar such as [employer] you can find a compound called N-acetylcysteine or NAC 600 mg, you can take this twice a day.  It is a natural anti-inflammatory that has helped people with pulmonary fibrosis previously.  We will see him in follow-up in 3 months time call sooner should any new problems arise.

## 2023-03-30 ENCOUNTER — Encounter: Payer: Self-pay | Admitting: Pulmonary Disease

## 2023-04-16 ENCOUNTER — Ambulatory Visit
Admission: RE | Admit: 2023-04-16 | Discharge: 2023-04-16 | Disposition: A | Discharge status: Home / Self Care | Payer: Medicare Other | Source: Ambulatory Visit | Attending: Pulmonary Disease | Admitting: Pulmonary Disease

## 2023-04-16 DIAGNOSIS — R0602 Shortness of breath: Secondary | ICD-10-CM | POA: Insufficient documentation

## 2023-04-16 DIAGNOSIS — J8410 Pulmonary fibrosis, unspecified: Secondary | ICD-10-CM | POA: Diagnosis not present

## 2023-04-16 DIAGNOSIS — R0600 Dyspnea, unspecified: Secondary | ICD-10-CM | POA: Diagnosis present

## 2023-04-16 DIAGNOSIS — I3489 Other nonrheumatic mitral valve disorders: Secondary | ICD-10-CM | POA: Insufficient documentation

## 2023-04-16 LAB — ECHOCARDIOGRAM COMPLETE
AR max vel: 2.31 cm2
AV Area VTI: 2.55 cm2
AV Area mean vel: 2.08 cm2
AV Mean grad: 3 mm[Hg]
AV Peak grad: 5.3 mm[Hg]
Ao pk vel: 1.15 m/s
Area-P 1/2: 3.77 cm2
MV VTI: 2.65 cm2
S' Lateral: 2.7 cm

## 2023-04-16 NOTE — Progress Notes (Signed)
*  PRELIMINARY RESULTS* Echocardiogram 2D Echocardiogram has been performed.  Cheyenne Wong 04/16/2023, 11:43 AM

## 2023-05-13 ENCOUNTER — Other Ambulatory Visit: Payer: Self-pay

## 2023-05-13 ENCOUNTER — Encounter: Payer: Medicare Other | Attending: Pulmonary Disease | Admitting: *Deleted

## 2023-05-13 DIAGNOSIS — J849 Interstitial pulmonary disease, unspecified: Secondary | ICD-10-CM

## 2023-05-13 NOTE — Progress Notes (Signed)
Virtual Visit completed. Patient informed on EP and RD appointment and 6 Minute walk test. Patient also informed of patient health questionnaires on My Chart. Patient Verbalizes understanding. Visit diagnosis can be found in [facility] 03/26/2023.

## 2023-05-20 ENCOUNTER — Encounter: Payer: Medicare Other | Attending: Pulmonary Disease

## 2023-05-20 VITALS — Ht 64.0 in | Wt 124.1 lb

## 2023-05-20 DIAGNOSIS — Z713 Dietary counseling and surveillance: Secondary | ICD-10-CM | POA: Insufficient documentation

## 2023-05-20 DIAGNOSIS — J849 Interstitial pulmonary disease, unspecified: Secondary | ICD-10-CM | POA: Diagnosis present

## 2023-05-20 DIAGNOSIS — R0602 Shortness of breath: Secondary | ICD-10-CM | POA: Diagnosis not present

## 2023-05-20 DIAGNOSIS — J84112 Idiopathic pulmonary fibrosis: Secondary | ICD-10-CM | POA: Diagnosis not present

## 2023-05-20 NOTE — Progress Notes (Signed)
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  24-hours Recall: B: cherry turnover, handful of pecans L: pimento cheese sandwich and tomato soup D: nothing  Beverages water (~60oz), coffee Alcohol none Caffeine coffee  Supplements Vitamin D, Vit C,  Intake Patterns Doesn't eat dinner, husband is paralyzed and goes to bed early, doesn't want to eat in front of him, she also has reflux Pt eats away from home 3 times a week.   Education r/t nutrition plan Patient drinking around 60oz of water daily. She has been losing weight for the past few months. Reports she is down ~20lbs. She has questions around adequate protein intake and maintaining her weight. Reviewed 24hr food recall. She doesn't eat dinner, her husband is paralyzed and goes to bed around 4:30pm. She has Reflux and doesn't like to eat dinner when he isn't eating or late in the evening. Spoke to her about nutrient and calorie dense foods, choosing balanced meals with healthy fats to help her eat more calories with less quantity of food. Also provided some ideas for calorie dense snacks she could eat in the evening to help her meet her nutritional goals without causing reflux. Built out several meals and snacks with foods she likes and will eat.    Goal 1: Include healthy fats and protein with a carb at each meals Goal 2: Eat 2 good meals and a calorie dense snacks each day Goal 3: Look into greek yogurt and protein shake items   End time 10:45 AM

## 2023-05-20 NOTE — Progress Notes (Signed)
Pulmonary Individual Treatment Plan  Patient Details  Name: ZAHRA PEFFLEY MRN: [id number] Date of Birth: Feb 11, 1951 Referring Provider:   Flowsheet Row Pulmonary Rehab from 05/20/2023 in [facility] Cardiac and Pulmonary Rehab  Referring Provider Dr. Sarina Ser MD       Initial Encounter Date:  Flowsheet Row Pulmonary Rehab from 05/20/2023 in [facility] Cardiac and Pulmonary Rehab  Date 05/20/23       Visit Diagnosis: ILD (interstitial lung disease) (HCC)  Patient's Home Medications on Admission:  Current Outpatient Medications:    atorvastatin (LIPITOR) 10 MG tablet, Take 10 mg by mouth daily., Disp: , Rfl:    Budeson-Glycopyrrol-Formoterol (BREZTRI AEROSPHERE) 160-9-4.8 MCG/ACT AERO, Inhale 2 puffs into the lungs in the morning and at bedtime., Disp: 10.7 g, Rfl: 11   Budeson-Glycopyrrol-Formoterol (BREZTRI AEROSPHERE) 160-9-4.8 MCG/ACT AERO, Inhale 2 puffs into the lungs in the morning and at bedtime. (Patient not taking: Reported on 03/26/2023), Disp: , Rfl:    chlorpheniramine-HYDROcodone (TUSSIONEX) 10-8 MG/5ML, Take by mouth. Take 5 mLs by mouth every 12 (twelve) hours as needed for Cough, Disp: , Rfl:    cholecalciferol (VITAMIN D) 25 MCG (1000 UNIT) tablet, Take 2,000 Units by mouth daily., Disp: , Rfl:    cyanocobalamin (VITAMIN B12) 1000 MCG tablet, Take 5,000 mcg by mouth once a week., Disp: , Rfl:    escitalopram (LEXAPRO) 5 MG tablet, Take 5 mg by mouth as needed. (Patient not taking: Reported on 02/12/2023), Disp: , Rfl:    esomeprazole (NEXIUM) 40 MG capsule, Take 40 mg by mouth daily at 12 noon., Disp: , Rfl:    famotidine (PEPCID) 20 MG tablet, Take 20 mg by mouth 2 (two) times daily. (Patient not taking: Reported on 05/13/2023), Disp: , Rfl:    ibuprofen (ADVIL) 200 MG tablet, Take 200 mg by mouth every 6 (six) hours as needed., Disp: , Rfl:    methylPREDNISolone (MEDROL DOSEPAK) 4 MG TBPK tablet, Take as directed in the package (Patient not taking: Reported on  05/13/2023), Disp: 21 tablet, Rfl: 0   Nintedanib (OFEV) 100 MG CAPS, Take 1 capsule (100 mg total) by mouth 2 (two) times daily. (Patient not taking: Reported on 02/12/2023), Disp: 180 capsule, Rfl: 3   OXYGEN, Inhale 2 L into the lungs at bedtime. And as needed, Disp: , Rfl:   Past Medical History: Past Medical History:  Diagnosis Date   Aortic atherosclerosis (HCC)    Chronic cough    Chronic cough    Dependence on nocturnal oxygen therapy    Dyspnea    from Pulmonary fibrosis   GERD (gastroesophageal reflux disease)    Grade I diastolic dysfunction    History of chronic rhinitis    Hyperparathyroidism (HCC)    Mild aortic regurgitation    Mild mitral regurgitation by prior echocardiogram    Mild tricuspid regurgitation by prior echocardiogram    Motion sickness    back seat of car   Nocturnal hypoxemia    Panic disorder    Polyarthralgia    Pulmonary fibrosis (HCC)    Right knee meniscal tear    UIP (usual interstitial pneumonitis) (HCC)    Vitamin D deficiency     Tobacco Use: Social History   Tobacco Use  Smoking Status Never  Smokeless Tobacco Never    Labs: Review Flowsheet       Latest Ref Rng & Units 08/26/2012 04/29/2015  Labs for ITP Cardiac and Pulmonary Rehab  Cholestrol 0 - 200 mg/dL 409  811   Direct LDL mg/dL  107.2  130.0   HDL-C >39.00 mg/dL 16.10  96.04   Trlycerides 0.0 - 149.0 mg/dL 540.9  811.9     Details             Pulmonary Assessment Scores:  Pulmonary Assessment Scores     Row Name 05/20/23 1019         mMRC Score   mMRC Score 2              UCSD: Self-administered rating of dyspnea associated with activities of daily living (ADLs) 6-point scale (0 = "not at all" to 5 = "maximal or unable to do because of breathlessness")  Scoring Scores range from 0 to 120.  Minimally important difference is 5 units  CAT: CAT can identify the health impairment of COPD patients and is better correlated with disease progression.   CAT has a scoring range of zero to 40. The CAT score is classified into four groups of low (less than 10), medium (10 - 20), high (21-30) and very high (31-40) based on the impact level of disease on health status. A CAT score over 10 suggests significant symptoms.  A worsening CAT score could be explained by an exacerbation, poor medication adherence, poor inhaler technique, or progression of COPD or comorbid conditions.  CAT MCID is 2 points  mMRC: mMRC (Modified Medical Research Council) Dyspnea Scale is used to assess the degree of baseline functional disability in patients of respiratory disease due to dyspnea. No minimal important difference is established. A decrease in score of 1 point or greater is considered a positive change.   Pulmonary Function Assessment:  Pulmonary Function Assessment - 05/13/23 0942       Breath   Shortness of Breath Yes;Fear of Shortness of Breath;Panic with Shortness of Breath;Limiting activity             Exercise Target Goals: Exercise Program Goal: Individual exercise prescription set using results from initial 6 min walk test and THRR while considering  patient's activity barriers and safety.   Exercise Prescription Goal: Initial exercise prescription builds to 30-45 minutes a day of aerobic activity, 2-3 days per week.  Home exercise guidelines will be given to patient during program as part of exercise prescription that the participant will acknowledge.  Education: Aerobic Exercise: - Group verbal and visual presentation on the components of exercise prescription. Introduces F.I.T.T principle from ACSM for exercise prescriptions.  Reviews F.I.T.T. principles of aerobic exercise including progression. Written material given at graduation.   Education: Resistance Exercise: - Group verbal and visual presentation on the components of exercise prescription. Introduces F.I.T.T principle from ACSM for exercise prescriptions  Reviews F.I.T.T.  principles of resistance exercise including progression. Written material given at graduation.    Education: Exercise & Equipment Safety: - Individual verbal instruction and demonstration of equipment use and safety with use of the equipment. Flowsheet Row Pulmonary Rehab from 05/13/2023 in [facility] Cardiac and Pulmonary Rehab  Date 05/13/23  Educator [facility]  Instruction Review Code 1- Verbalizes Understanding       Education: Exercise Physiology & General Exercise Guidelines: - Group verbal and written instruction with models to review the exercise physiology of the cardiovascular system and associated critical values. Provides general exercise guidelines with specific guidelines to those with heart or lung disease.    Education: Flexibility, Balance, Mind/Body Relaxation: - Group verbal and visual presentation with interactive activity on the components of exercise prescription. Introduces F.I.T.T principle from ACSM for exercise prescriptions. Reviews F.I.T.T. principles of flexibility  and balance exercise training including progression. Also discusses the mind body connection.  Reviews various relaxation techniques to help reduce and manage stress (i.e. Deep breathing, progressive muscle relaxation, and visualization). Balance handout provided to take home. Written material given at graduation.   Activity Barriers & Risk Stratification:   6 Minute Walk:  6 Minute Walk     Row Name 05/20/23 1019         6 Minute Walk   Phase Initial     Distance 1125 feet     Walk Time 6 minutes     # of Rest Breaks 0     MPH 2.13     METS 2.99     RPE 9     Perceived Dyspnea  2     VO2 Peak 10.45     Symptoms No     Resting HR 61 bpm     Resting BP 128/72     Resting Oxygen Saturation  98 %     Exercise Oxygen Saturation  during 6 min walk 93 %     Max Ex. HR 101 bpm     Max Ex. BP 146/76     2 Minute Post BP 126/70       Interval HR   1 Minute HR 87     2 Minute HR 96     3 Minute HR  100     4 Minute HR 96     5 Minute HR 99     6 Minute HR 101     2 Minute Post HR 68     Interval Heart Rate? Yes       Interval Oxygen   Interval Oxygen? Yes     Baseline Oxygen Saturation % 98 %     1 Minute Oxygen Saturation % 97 %     1 Minute Liters of Oxygen 0 L  RA     2 Minute Oxygen Saturation % 96 %     2 Minute Liters of Oxygen 0 L     3 Minute Oxygen Saturation % 95 %     3 Minute Liters of Oxygen 0 L     4 Minute Oxygen Saturation % 95 %     4 Minute Liters of Oxygen 0 L     5 Minute Oxygen Saturation % 97 %     5 Minute Liters of Oxygen 0 L     6 Minute Oxygen Saturation % 93 %     6 Minute Liters of Oxygen 0 L     2 Minute Post Oxygen Saturation % 97 %     2 Minute Post Liters of Oxygen 0 L             Oxygen Initial Assessment:  Oxygen Initial Assessment - 05/13/23 0941       Home Oxygen   Home Oxygen Device Home Concentrator    Sleep Oxygen Prescription Continuous    Liters per minute 2    Home Exercise Oxygen Prescription None    Home Resting Oxygen Prescription None    Compliance with Home Oxygen Use Yes      Initial 6 min Walk   Oxygen Used None      Program Oxygen Prescription   Program Oxygen Prescription None      Intervention   Short Term Goals To learn and exhibit compliance with exercise, home and travel O2 prescription;To learn and understand importance of monitoring SPO2 with pulse oximeter and  demonstrate accurate use of the pulse oximeter.;To learn and understand importance of maintaining oxygen saturations>88%;To learn and demonstrate proper pursed lip breathing techniques or other breathing techniques. ;To learn and demonstrate proper use of respiratory medications    Long  Term Goals Exhibits compliance with exercise, home  and travel O2 prescription;Verbalizes importance of monitoring SPO2 with pulse oximeter and return demonstration;Maintenance of O2 saturations>88%;Exhibits proper breathing techniques, such as pursed lip breathing  or other method taught during program session;Compliance with respiratory medication;Demonstrates proper use of MDI's             Oxygen Re-Evaluation:   Oxygen Discharge (Final Oxygen Re-Evaluation):   Initial Exercise Prescription:  Initial Exercise Prescription - 05/20/23 1100       Date of Initial Exercise RX and Referring Provider   Date 05/20/23    Referring Provider Dr. Sarina Ser MD      Oxygen   Maintain Oxygen Saturation 88% or higher      Recumbant Bike   Level 1    RPM 50    Watts 25    Minutes 15    METs 2.99      NuStep   Level 2    SPM 80    Minutes 15    METs 2.99      T5 Nustep   Level 1    SPM 80    Minutes 15    METs 2.99      Track   Laps 30    Minutes 15    METs 2.63      Prescription Details   Frequency (times per week) 2    Duration Progress to 30 minutes of continuous aerobic without signs/symptoms of physical distress      Intensity   THRR 40-80% of Max Heartrate 96-132    Ratings of Perceived Exertion 11-13    Perceived Dyspnea 0-4      Progression   Progression Continue to progress workloads to maintain intensity without signs/symptoms of physical distress.      Resistance Training   Training Prescription Yes    Weight 2 lb    Reps 10-15             Perform Capillary Blood Glucose checks as needed.  Exercise Prescription Changes:   Exercise Prescription Changes     Row Name 05/20/23 1100             Response to Exercise   Blood Pressure (Admit) 128/72       Blood Pressure (Exercise) 146/76       Blood Pressure (Exit) 126/70       Heart Rate (Admit) 61 bpm       Heart Rate (Exercise) 101 bpm       Heart Rate (Exit) 68 bpm       Oxygen Saturation (Admit) 98 %       Oxygen Saturation (Exercise) 93 %       Oxygen Saturation (Exit) 97 %       Rating of Perceived Exertion (Exercise) 9       Perceived Dyspnea (Exercise) 2       Symptoms None       Comments Results                 Exercise Comments:   Exercise Goals and Review:   Exercise Goals     Row Name 05/20/23 1155             Exercise Goals  Increase Physical Activity Yes       Intervention Provide advice, education, support and counseling about physical activity/exercise needs.;Develop an individualized exercise prescription for aerobic and resistive training based on initial evaluation findings, risk stratification, comorbidities and participant's personal goals.       Expected Outcomes Long Term: Exercising regularly at least 3-5 days a week.;Long Term: Add in home exercise to make exercise part of routine and to increase amount of physical activity.;Short Term: Attend rehab on a regular basis to increase amount of physical activity.       Increase Strength and Stamina Yes       Intervention Provide advice, education, support and counseling about physical activity/exercise needs.;Develop an individualized exercise prescription for aerobic and resistive training based on initial evaluation findings, risk stratification, comorbidities and participant's personal goals.       Expected Outcomes Long Term: Improve cardiorespiratory fitness, muscular endurance and strength as measured by increased METs and functional capacity ( );Short Term: Perform resistance training exercises routinely during rehab and add in resistance training at home;Short Term: Increase workloads from initial exercise prescription for resistance, speed, and METs.       Able to understand and use rate of perceived exertion (RPE) scale Yes       Intervention Provide education and explanation on how to use RPE scale       Expected Outcomes Short Term: Able to use RPE daily in rehab to express subjective intensity level;Long Term:  Able to use RPE to guide intensity level when exercising independently       Able to understand and use Dyspnea scale Yes       Intervention Provide education and explanation on how to use Dyspnea scale        Expected Outcomes Short Term: Able to use Dyspnea scale daily in rehab to express subjective sense of shortness of breath during exertion;Long Term: Able to use Dyspnea scale to guide intensity level when exercising independently       Knowledge and understanding of Target Heart Rate Range (THRR) Yes       Intervention Provide education and explanation of THRR including how the numbers were predicted and where they are located for reference       Expected Outcomes Short Term: Able to state/look up THRR;Long Term: Able to use THRR to govern intensity when exercising independently;Short Term: Able to use daily as guideline for intensity in rehab       Able to check pulse independently Yes       Intervention Provide education and demonstration on how to check pulse in carotid and radial arteries.;Review the importance of being able to check your own pulse for safety during independent exercise       Expected Outcomes Short Term: Able to explain why pulse checking is important during independent exercise;Long Term: Able to check pulse independently and accurately       Understanding of Exercise Prescription Yes       Intervention Provide education, explanation, and written materials on patient's individual exercise prescription       Expected Outcomes Short Term: Able to explain program exercise prescription;Long Term: Able to explain home exercise prescription to exercise independently                Exercise Goals Re-Evaluation :   Discharge Exercise Prescription (Final Exercise Prescription Changes):  Exercise Prescription Changes - 05/20/23 1100       Response to Exercise   Blood Pressure (Admit) 128/72  Blood Pressure (Exercise) 146/76    Blood Pressure (Exit) 126/70    Heart Rate (Admit) 61 bpm    Heart Rate (Exercise) 101 bpm    Heart Rate (Exit) 68 bpm    Oxygen Saturation (Admit) 98 %    Oxygen Saturation (Exercise) 93 %    Oxygen Saturation (Exit) 97 %    Rating of  Perceived Exertion (Exercise) 9    Perceived Dyspnea (Exercise) 2    Symptoms None    Comments Results             Nutrition:  Target Goals: Understanding of nutrition guidelines, daily intake of sodium 1500mg , cholesterol 200mg , calories 30% from fat and 7% or less from saturated fats, daily to have 5 or more servings of fruits and vegetables.  Education: All About Nutrition: -Group instruction provided by verbal, written material, interactive activities, discussions, models, and posters to present general guidelines for heart healthy nutrition including fat, fiber, MyPlate, the role of sodium in heart healthy nutrition, utilization of the nutrition label, and utilization of this knowledge for meal planning. Follow up email sent as well. Written material given at graduation.   Biometrics:  Pre Biometrics - 05/20/23 1157       Pre Biometrics   Height 5\' 4"  (1.626 m)    Weight 124 lb 1.6 oz (56.3 kg)    Waist Circumference 30 inches    Hip Circumference 37 inches    Waist to Hip Ratio 0.81 %    BMI (Calculated) 21.29    Single Leg Stand 12.1 seconds              Nutrition Therapy Plan and Nutrition Goals:   Nutrition Assessments:  MEDIFICTS Score Key: >=70 Need to make dietary changes  40-70 Heart Healthy Diet <= 40 Therapeutic Level Cholesterol Diet   Picture Your Plate Scores: <16 Unhealthy dietary pattern with much room for improvement. 41-50 Dietary pattern unlikely to meet recommendations for good health and room for improvement. 51-60 More healthful dietary pattern, with some room for improvement.  >60 Healthy dietary pattern, although there may be some specific behaviors that could be improved.   Nutrition Goals Re-Evaluation:   Nutrition Goals Discharge (Final Nutrition Goals Re-Evaluation):   Psychosocial: Target Goals: Acknowledge presence or absence of significant depression and/or stress, maximize coping skills, provide positive support  system. Participant is able to verbalize types and ability to use techniques and skills needed for reducing stress and depression.   Education: Stress, Anxiety, and Depression - Group verbal and visual presentation to define topics covered.  Reviews how body is impacted by stress, anxiety, and depression.  Also discusses healthy ways to reduce stress and to treat/manage anxiety and depression.  Written material given at graduation.   Education: Sleep Hygiene -Provides group verbal and written instruction about how sleep can affect your health.  Define sleep hygiene, discuss sleep cycles and impact of sleep habits. Review good sleep hygiene tips.    Initial Review & Psychosocial Screening:  Initial Psych Review & Screening - 05/13/23 0944       Initial Review   Current issues with None Identified      Family Dynamics   Good Support System? Yes    Comments She can look to her husband and sons for support. She has had some anxiety and panic in the past but no longer feels that way.      Barriers   Psychosocial barriers to participate in program The patient should benefit from  training in stress management and relaxation.;There are no identifiable barriers or psychosocial needs.      Screening Interventions   Interventions Encouraged to exercise;Program counselor consult;To provide support and resources with identified psychosocial needs;Provide feedback about the scores to participant    Expected Outcomes Short Term goal: Utilizing psychosocial counselor, staff and physician to assist with identification of specific Stressors or current issues interfering with healing process. Setting desired goal for each stressor or current issue identified.;Long Term Goal: Stressors or current issues are controlled or eliminated.;Short Term goal: Identification and review with participant of any Quality of Life or Depression concerns found by scoring the questionnaire.;Long Term goal: The participant  improves quality of Life and PHQ9 Scores as seen by post scores and/or verbalization of changes             Quality of Life Scores:  Scores of 19 and below usually indicate a poorer quality of life in these areas.  A difference of  2-3 points is a clinically meaningful difference.  A difference of 2-3 points in the total score of the Quality of Life Index has been associated with significant improvement in overall quality of life, self-image, physical symptoms, and general health in studies assessing change in quality of life.  PHQ-9: Review Flowsheet       05/20/2023  Depression screen PHQ 2/9  Decreased Interest 1  Down, Depressed, Hopeless 1  PHQ - 2 Score 2  Altered sleeping 2  Tired, decreased energy 2  Change in appetite 1  Feeling bad or failure about yourself  0  Trouble concentrating 0  Moving slowly or fidgety/restless 1  Suicidal thoughts 0  PHQ-9 Score 8  Difficult doing work/chores Somewhat difficult    Details           Interpretation of Total Score  Total Score Depression Severity:  1-4 = Minimal depression, 5-9 = Mild depression, 10-14 = Moderate depression, 15-19 = Moderately severe depression, 20-27 = Severe depression   Psychosocial Evaluation and Intervention:  Psychosocial Evaluation - 05/13/23 0945       Psychosocial Evaluation & Interventions   Interventions Stress management education;Relaxation education;Encouraged to exercise with the program and follow exercise prescription    Comments She can look to her husband and sons for support. She has had some anxiety and panic in the past but no longer feels that way.    Expected Outcomes Short: Start [facility] to help with mood. Long: Maintain a healthy mental state.    Continue Psychosocial Services  Follow up required by staff             Psychosocial Re-Evaluation:   Psychosocial Discharge (Final Psychosocial Re-Evaluation):   Education: Education Goals: Education classes will be  provided on a weekly basis, covering required topics. Participant will state understanding/return demonstration of topics presented.  Learning Barriers/Preferences:  Learning Barriers/Preferences - 05/13/23 0943       Learning Barriers/Preferences   Learning Barriers None    Learning Preferences None             General Pulmonary Education Topics:  Infection Prevention: - Provides verbal and written material to individual with discussion of infection control including proper hand washing and proper equipment cleaning during exercise session. Flowsheet Row Pulmonary Rehab from 05/13/2023 in [facility] Cardiac and Pulmonary Rehab  Date 05/13/23  Educator [facility]  Instruction Review Code 1- Verbalizes Understanding       Falls Prevention: - Provides verbal and written material to individual with discussion of falls  prevention and safety. Flowsheet Row Pulmonary Rehab from 05/13/2023 in [facility] Cardiac and Pulmonary Rehab  Date 05/13/23  Educator [facility]  Instruction Review Code 1- Verbalizes Understanding       Chronic Lung Disease Review: - Group verbal instruction with posters, models, PowerPoint presentations and videos,  to review new updates, new respiratory medications, new advancements in procedures and treatments. Providing information on websites and "800" numbers for continued self-education. Includes information about supplement oxygen, available portable oxygen systems, continuous and intermittent flow rates, oxygen safety, concentrators, and Medicare reimbursement for oxygen. Explanation of Pulmonary Drugs, including class, frequency, complications, importance of spacers, rinsing mouth after steroid MDI's, and proper cleaning methods for nebulizers. Review of basic lung anatomy and physiology related to function, structure, and complications of lung disease. Review of risk factors. Discussion about methods for diagnosing sleep apnea and types of masks and machines for OSA. Includes a  review of the use of types of environmental controls: home humidity, furnaces, filters, dust mite/pet prevention, HEPA vacuums. Discussion about weather changes, air quality and the benefits of nasal washing. Instruction on Warning signs, infection symptoms, calling MD promptly, preventive modes, and value of vaccinations. Review of effective airway clearance, coughing and/or vibration techniques. Emphasizing that all should Create an Action Plan. Written material given at graduation.   AED/CPR: - Group verbal and written instruction with the use of models to demonstrate the basic use of the AED with the basic ABC's of resuscitation.    Anatomy and Cardiac Procedures: - Group verbal and visual presentation and models provide information about basic cardiac anatomy and function. Reviews the testing methods done to diagnose heart disease and the outcomes of the test results. Describes the treatment choices: Medical Management, Angioplasty, or Coronary Bypass Surgery for treating various heart conditions including Myocardial Infarction, Angina, Valve Disease, and Cardiac Arrhythmias.  Written material given at graduation.   Medication Safety: - Group verbal and visual instruction to review commonly prescribed medications for heart and lung disease. Reviews the medication, class of the drug, and side effects. Includes the steps to properly store meds and maintain the prescription regimen.  Written material given at graduation.   Other: -Provides group and verbal instruction on various topics (see comments)   Knowledge Questionnaire Score:    Core Components/Risk Factors/Patient Goals at Admission:  Personal Goals and Risk Factors at Admission - 05/13/23 0943       Core Components/Risk Factors/Patient Goals on Admission    Weight Management Yes;Weight Maintenance    Intervention Weight Management: Develop a combined nutrition and exercise program designed to reach desired caloric intake,  while maintaining appropriate intake of nutrient and fiber, sodium and fats, and appropriate energy expenditure required for the weight goal.;Weight Management: Provide education and appropriate resources to help participant work on and attain dietary goals.;Weight Management/Obesity: Establish reasonable short term and long term weight goals.    Expected Outcomes Short Term: Continue to assess and modify interventions until short term weight is achieved;Long Term: Adherence to nutrition and physical activity/exercise program aimed toward attainment of established weight goal;Weight Maintenance: Understanding of the daily nutrition guidelines, which includes 25-35% calories from fat, 7% or less cal from saturated fats, less than 200mg  cholesterol, less than 1.5gm of sodium, & 5 or more servings of fruits and vegetables daily;Understanding recommendations for meals to include 15-35% energy as protein, 25-35% energy from fat, 35-60% energy from carbohydrates, less than 200mg  of dietary cholesterol, 20-35 gm of total fiber daily;Understanding of distribution of calorie intake throughout the day  with the consumption of 4-5 meals/snacks    Improve shortness of breath with ADL's Yes    Intervention Provide education, individualized exercise plan and daily activity instruction to help decrease symptoms of SOB with activities of daily living.    Expected Outcomes Short Term: Improve cardiorespiratory fitness to achieve a reduction of symptoms when performing ADLs;Long Term: Be able to perform more ADLs without symptoms or delay the onset of symptoms    Lipids Yes    Intervention Provide education and support for participant on nutrition & aerobic/resistive exercise along with prescribed medications to achieve LDL 70mg , HDL >40mg .    Expected Outcomes Short Term: Participant states understanding of desired cholesterol values and is compliant with medications prescribed. Participant is following exercise prescription  and nutrition guidelines.;Long Term: Cholesterol controlled with medications as prescribed, with individualized exercise RX and with personalized nutrition plan. Value goals: LDL < 70mg , HDL > 40 mg.             Education:Diabetes - Individual verbal and written instruction to review signs/symptoms of diabetes, desired ranges of glucose level fasting, after meals and with exercise. Acknowledge that pre and post exercise glucose checks will be done for 3 sessions at entry of program.   Know Your Numbers and Heart Failure: - Group verbal and visual instruction to discuss disease risk factors for cardiac and pulmonary disease and treatment options.  Reviews associated critical values for Overweight/Obesity, Hypertension, Cholesterol, and Diabetes.  Discusses basics of heart failure: signs/symptoms and treatments.  Introduces Heart Failure Zone chart for action plan for heart failure.  Written material given at graduation.   Core Components/Risk Factors/Patient Goals Review:    Core Components/Risk Factors/Patient Goals at Discharge (Final Review):    ITP Comments:  ITP Comments     Row Name 05/13/23 0941 05/20/23 1019         ITP Comments Virtual Visit completed. Patient informed on EP and RD appointment and 6 Minute walk test. Patient also informed of patient health questionnaires on My Chart. Patient Verbalizes understanding. Visit diagnosis can be found in [facility] 03/26/2023. Completed and gym orientation. Initial ITP created and sent for review to Dr. Jinny Sanders, Medical Director.               Comments: Initial ITP

## 2023-05-20 NOTE — Patient Instructions (Addendum)
Patient Instructions  Patient Details  Name: Cheyenne Wong MRN: [id number] Date of Birth: 05/11/1951 Referring Provider:  Salena Saner, MD  Below are your personal goals for exercise, nutrition, and risk factors. Our goal is to help you stay on track towards obtaining and maintaining these goals. We will be discussing your progress on these goals with you throughout the program.  Initial Exercise Prescription:  Initial Exercise Prescription - 05/20/23 1100       Date of Initial Exercise RX and Referring Provider   Date 05/20/23    Referring Provider Dr. Sarina Ser MD      Oxygen   Maintain Oxygen Saturation 88% or higher      Recumbant Bike   Level 1    RPM 50    Watts 25    Minutes 15    METs 2.99      NuStep   Level 2    SPM 80    Minutes 15    METs 2.99      T5 Nustep   Level 1    SPM 80    Minutes 15    METs 2.99      Track   Laps 30    Minutes 15    METs 2.63      Prescription Details   Frequency (times per week) 2    Duration Progress to 30 minutes of continuous aerobic without signs/symptoms of physical distress      Intensity   THRR 40-80% of Max Heartrate 96-132    Ratings of Perceived Exertion 11-13    Perceived Dyspnea 0-4      Progression   Progression Continue to progress workloads to maintain intensity without signs/symptoms of physical distress.      Resistance Training   Training Prescription Yes    Weight 2 lb    Reps 10-15             Exercise Goals: Frequency: Be able to perform aerobic exercise two to three times per week in program working toward 2-5 days per week of home exercise.  Intensity: Work with a perceived exertion of 11 (fairly light) - 15 (hard) while following your exercise prescription.  We will make changes to your prescription with you as you progress through the program.   Duration: Be able to do 30 to 45 minutes of continuous aerobic exercise in addition to a 5 minute warm-up and a 5 minute  cool-down routine.   Nutrition Goals: Your personal nutrition goals will be established when you do your nutrition analysis with the dietician.  The following are general nutrition guidelines to follow: Cholesterol < 200mg /day Sodium < 1500mg /day Fiber: Women over 50 yrs - 21 grams per day  Personal Goals:  Personal Goals and Risk Factors at Admission - 05/13/23 0943       Core Components/Risk Factors/Patient Goals on Admission    Weight Management Yes;Weight Maintenance    Intervention Weight Management: Develop a combined nutrition and exercise program designed to reach desired caloric intake, while maintaining appropriate intake of nutrient and fiber, sodium and fats, and appropriate energy expenditure required for the weight goal.;Weight Management: Provide education and appropriate resources to help participant work on and attain dietary goals.;Weight Management/Obesity: Establish reasonable short term and long term weight goals.    Expected Outcomes Short Term: Continue to assess and modify interventions until short term weight is achieved;Long Term: Adherence to nutrition and physical activity/exercise program aimed toward attainment of established weight goal;Weight Maintenance: Understanding  of the daily nutrition guidelines, which includes 25-35% calories from fat, 7% or less cal from saturated fats, less than 200mg  cholesterol, less than 1.5gm of sodium, & 5 or more servings of fruits and vegetables daily;Understanding recommendations for meals to include 15-35% energy as protein, 25-35% energy from fat, 35-60% energy from carbohydrates, less than 200mg  of dietary cholesterol, 20-35 gm of total fiber daily;Understanding of distribution of calorie intake throughout the day with the consumption of 4-5 meals/snacks    Improve shortness of breath with ADL's Yes    Intervention Provide education, individualized exercise plan and daily activity instruction to help decrease symptoms of SOB  with activities of daily living.    Expected Outcomes Short Term: Improve cardiorespiratory fitness to achieve a reduction of symptoms when performing ADLs;Long Term: Be able to perform more ADLs without symptoms or delay the onset of symptoms    Lipids Yes    Intervention Provide education and support for participant on nutrition & aerobic/resistive exercise along with prescribed medications to achieve LDL 70mg , HDL >40mg .    Expected Outcomes Short Term: Participant states understanding of desired cholesterol values and is compliant with medications prescribed. Participant is following exercise prescription and nutrition guidelines.;Long Term: Cholesterol controlled with medications as prescribed, with individualized exercise RX and with personalized nutrition plan. Value goals: LDL < 70mg , HDL > 40 mg.             Exercise Goals and Review:  Exercise Goals     Row Name 05/20/23 1155             Exercise Goals   Increase Physical Activity Yes       Intervention Provide advice, education, support and counseling about physical activity/exercise needs.;Develop an individualized exercise prescription for aerobic and resistive training based on initial evaluation findings, risk stratification, comorbidities and participant's personal goals.       Expected Outcomes Long Term: Exercising regularly at least 3-5 days a week.;Long Term: Add in home exercise to make exercise part of routine and to increase amount of physical activity.;Short Term: Attend rehab on a regular basis to increase amount of physical activity.       Increase Strength and Stamina Yes       Intervention Provide advice, education, support and counseling about physical activity/exercise needs.;Develop an individualized exercise prescription for aerobic and resistive training based on initial evaluation findings, risk stratification, comorbidities and participant's personal goals.       Expected Outcomes Long Term: Improve  cardiorespiratory fitness, muscular endurance and strength as measured by increased METs and functional capacity ( );Short Term: Perform resistance training exercises routinely during rehab and add in resistance training at home;Short Term: Increase workloads from initial exercise prescription for resistance, speed, and METs.       Able to understand and use rate of perceived exertion (RPE) scale Yes       Intervention Provide education and explanation on how to use RPE scale       Expected Outcomes Short Term: Able to use RPE daily in rehab to express subjective intensity level;Long Term:  Able to use RPE to guide intensity level when exercising independently       Able to understand and use Dyspnea scale Yes       Intervention Provide education and explanation on how to use Dyspnea scale       Expected Outcomes Short Term: Able to use Dyspnea scale daily in rehab to express subjective sense of shortness of breath during exertion;Long Term: Able  to use Dyspnea scale to guide intensity level when exercising independently       Knowledge and understanding of Target Heart Rate Range (THRR) Yes       Intervention Provide education and explanation of THRR including how the numbers were predicted and where they are located for reference       Expected Outcomes Short Term: Able to state/look up THRR;Long Term: Able to use THRR to govern intensity when exercising independently;Short Term: Able to use daily as guideline for intensity in rehab       Able to check pulse independently Yes       Intervention Provide education and demonstration on how to check pulse in carotid and radial arteries.;Review the importance of being able to check your own pulse for safety during independent exercise       Expected Outcomes Short Term: Able to explain why pulse checking is important during independent exercise;Long Term: Able to check pulse independently and accurately       Understanding of Exercise Prescription Yes        Intervention Provide education, explanation, and written materials on patient's individual exercise prescription       Expected Outcomes Short Term: Able to explain program exercise prescription;Long Term: Able to explain home exercise prescription to exercise independently

## 2023-05-22 ENCOUNTER — Encounter: Payer: Medicare Other | Admitting: *Deleted

## 2023-05-22 DIAGNOSIS — J84112 Idiopathic pulmonary fibrosis: Secondary | ICD-10-CM | POA: Diagnosis not present

## 2023-05-22 DIAGNOSIS — J849 Interstitial pulmonary disease, unspecified: Secondary | ICD-10-CM

## 2023-05-22 NOTE — Progress Notes (Signed)
Daily Session Note  Patient Details  Name: Cheyenne Wong MRN: [id number] Date of Birth: 10-16-1950 Referring Provider:   Flowsheet Row Pulmonary Rehab from 05/20/2023 in [facility] Card[facility]  Referring Provider Dr. Sarina Ser MD       Encounter Date: 05/22/2023  Check In:  Session Check In - 05/22/23 0930       Check-In   Supervising physician immediately available to respond to emergencies See telemetry face sheet for immediately available ER MD    Location [facility]-Cardiac & Pulmonary Rehab    Staff Present Ronette Deter, BS, Exercise Physiologist;Joseph Foresthill, Guinevere Ferrari, RN, ADN;[employer], , Exercise Physiologist    Virtual Visit No    Medication changes reported     No    Fall or balance concerns reported    No    Warm-up and Cool-down Performed on first and last piece of equipment    Resistance Training Performed Yes    VAD Patient? No    PAD/SET Patient? No      Pain Assessment   Currently in Pain? No/denies                Social History   Tobacco Use  Smoking Status Never  Smokeless Tobacco Never    Goals Met:  Independence with exercise equipment Exercise tolerated well No report of concerns or symptoms today Strength training completed today  Goals Unmet:  Not Applicable  Comments: First full day of exercise!  Patient was oriented to gym and equipment including functions, settings, policies, and procedures.  Patient's individual exercise prescription and treatment plan were reviewed.  All starting workloads were established based on the results of the 6 minute walk test done at initial orientation visit.  The plan for exercise progression was also introduced and progression will be customized based on patient's performance and goals.    Dr. Bethann Punches is Medical Director for [facility] Cardiac Rehabilitation.  Dr. Vida Rigger is Medical Director for [facility] Pul[facility] Rehabilitation.

## 2023-05-27 ENCOUNTER — Encounter: Payer: Medicare Other | Admitting: *Deleted

## 2023-06-03 ENCOUNTER — Encounter: Payer: Self-pay | Admitting: *Deleted

## 2023-06-03 DIAGNOSIS — J849 Interstitial pulmonary disease, unspecified: Secondary | ICD-10-CM

## 2023-06-03 NOTE — Progress Notes (Signed)
Discharge Summary:  Cheyenne Wong  (DOB: 1951/06/21)  [name] requested to discharge early at this time as she is caring for her husband at home and unable to make it to classes. Jill completed 2/36 sessions.    6 Minute Walk     Row Name 05/20/23 1019         6 Minute Walk   Phase Initial     Distance 1125 feet     Walk Time 6 minutes     # of Rest Breaks 0     MPH 2.13     METS 2.99     RPE 9     Perceived Dyspnea  2     VO2 Peak 10.45     Symptoms No     Resting HR 61 bpm     Resting BP 128/72     Resting Oxygen Saturation  98 %     Exercise Oxygen Saturation  during 6 min walk 93 %     Max Ex. HR 101 bpm     Max Ex. BP 146/76     2 Minute Post BP 126/70       Interval HR   1 Minute HR 87     2 Minute HR 96     3 Minute HR 100     4 Minute HR 96     5 Minute HR 99     6 Minute HR 101     2 Minute Post HR 68     Interval Heart Rate? Yes       Interval Oxygen   Interval Oxygen? Yes     Baseline Oxygen Saturation % 98 %     1 Minute Oxygen Saturation % 97 %     1 Minute Liters of Oxygen 0 L  RA     2 Minute Oxygen Saturation % 96 %     2 Minute Liters of Oxygen 0 L     3 Minute Oxygen Saturation % 95 %     3 Minute Liters of Oxygen 0 L     4 Minute Oxygen Saturation % 95 %     4 Minute Liters of Oxygen 0 L     5 Minute Oxygen Saturation % 97 %     5 Minute Liters of Oxygen 0 L     6 Minute Oxygen Saturation % 93 %     6 Minute Liters of Oxygen 0 L     2 Minute Post Oxygen Saturation % 97 %     2 Minute Post Liters of Oxygen 0 L

## 2023-06-03 NOTE — Progress Notes (Signed)
Pulmonary Individual Treatment Plan  Patient Details  Name: Cheyenne Wong MRN: [id number] Date of Birth: [birth year] Referring Provider:   Flowsheet Row Pulmonary Rehab from 05/20/2023 in [facility] Cardiac and Pulmonary Rehab  Referring Provider Dr. Sarina Ser MD       Initial Encounter Date:  Flowsheet Row Pulmonary Rehab from 05/20/2023 in [facility] Cardiac and Pulmonary Rehab  Date 05/20/23       Visit Diagnosis: ILD (interstitial lung disease) (HCC)  Patient's Home Medications on Admission:  Current Outpatient Medications:    atorvastatin (LIPITOR) 10 MG tablet, Take 10 mg by mouth daily., Disp: , Rfl:    Budeson-Glycopyrrol-Formoterol (BREZTRI AEROSPHERE) 160-9-4.8 MCG/ACT AERO, Inhale 2 puffs into the lungs in the morning and at bedtime., Disp: 10.7 g, Rfl: 11   Budeson-Glycopyrrol-Formoterol (BREZTRI AEROSPHERE) 160-9-4.8 MCG/ACT AERO, Inhale 2 puffs into the lungs in the morning and at bedtime. (Patient not taking: Reported on 03/26/2023), Disp: , Rfl:    chlorpheniramine-HYDROcodone (TUSSIONEX) 10-8 MG/5ML, Take by mouth. Take 5 mLs by mouth every 12 (twelve) hours as needed for Cough, Disp: , Rfl:    cholecalciferol (VITAMIN D) 25 MCG (1000 UNIT) tablet, Take 2,000 Units by mouth daily., Disp: , Rfl:    cyanocobalamin (VITAMIN B12) 1000 MCG tablet, Take 5,000 mcg by mouth once a week., Disp: , Rfl:    escitalopram (LEXAPRO) 5 MG tablet, Take 5 mg by mouth as needed. (Patient not taking: Reported on 02/12/2023), Disp: , Rfl:    esomeprazole (NEXIUM) 40 MG capsule, Take 40 mg by mouth daily at 12 noon., Disp: , Rfl:    famotidine (PEPCID) 20 MG tablet, Take 20 mg by mouth 2 (two) times daily. (Patient not taking: Reported on 05/13/2023), Disp: , Rfl:    ibuprofen (ADVIL) 200 MG tablet, Take 200 mg by mouth every 6 (six) hours as needed., Disp: , Rfl:    methylPREDNISolone (MEDROL DOSEPAK) 4 MG TBPK tablet, Take as directed in the package (Patient not taking: Reported on  05/13/2023), Disp: 21 tablet, Rfl: 0   Nintedanib (OFEV) 100 MG CAPS, Take 1 capsule (100 mg total) by mouth 2 (two) times daily. (Patient not taking: Reported on 02/12/2023), Disp: 180 capsule, Rfl: 3   OXYGEN, Inhale 2 L into the lungs at bedtime. And as needed, Disp: , Rfl:   Past Medical History: Past Medical History:  Diagnosis Date   Aortic atherosclerosis (HCC)    Chronic cough    Chronic cough    Dependence on nocturnal oxygen therapy    Dyspnea    from Pulmonary fibrosis   GERD (gastroesophageal reflux disease)    Grade I diastolic dysfunction    History of chronic rhinitis    Hyperparathyroidism (HCC)    Mild aortic regurgitation    Mild mitral regurgitation by prior echocardiogram    Mild tricuspid regurgitation by prior echocardiogram    Motion sickness    back seat of car   Nocturnal hypoxemia    Panic disorder    Polyarthralgia    Pulmonary fibrosis (HCC)    Right knee meniscal tear    UIP (usual interstitial pneumonitis) (HCC)    Vitamin D deficiency     Tobacco Use: Social History   Tobacco Use  Smoking Status Never  Smokeless Tobacco Never    Labs: Review Flowsheet       Latest Ref Rng & Units 08/26/2012 04/29/2015  Labs for ITP Cardiac and Pulmonary Rehab  Cholestrol 0 - 200 mg/dL 696  295   Direct LDL mg/dL  107.2  130.0   HDL-C >39.00 mg/dL 40.98  11.91   Trlycerides 0.0 - 149.0 mg/dL 478.2  956.2     Details             Pulmonary Assessment Scores:  Pulmonary Assessment Scores     Row Name 05/20/23 1019 05/22/23 0928       ADL UCSD   ADL Phase -- Entry    SOB Score total -- 73    Rest -- 3    Walk -- 3    Stairs -- 5    Bath -- 3    Dress -- 3    Shop -- 4      CAT Score   CAT Score -- 25      mMRC Score   mMRC Score 2 --             UCSD: Self-administered rating of dyspnea associated with activities of daily living (ADLs) 6-point scale (0 = "not at all" to 5 = "maximal or unable to do because of  breathlessness")  Scoring Scores range from 0 to 120.  Minimally important difference is 5 units  CAT: CAT can identify the health impairment of COPD patients and is better correlated with disease progression.  CAT has a scoring range of zero to 40. The CAT score is classified into four groups of low (less than 10), medium (10 - 20), high (21-30) and very high (31-40) based on the impact level of disease on health status. A CAT score over 10 suggests significant symptoms.  A worsening CAT score could be explained by an exacerbation, poor medication adherence, poor inhaler technique, or progression of COPD or comorbid conditions.  CAT MCID is 2 points  mMRC: mMRC (Modified Medical Research Council) Dyspnea Scale is used to assess the degree of baseline functional disability in patients of respiratory disease due to dyspnea. No minimal important difference is established. A decrease in score of 1 point or greater is considered a positive change.   Pulmonary Function Assessment:  Pulmonary Function Assessment - 05/13/23 0942       Breath   Shortness of Breath Yes;Fear of Shortness of Breath;Panic with Shortness of Breath;Limiting activity             Exercise Target Goals: Exercise Program Goal: Individual exercise prescription set using results from initial 6 min walk test and THRR while considering  patient's activity barriers and safety.   Exercise Prescription Goal: Initial exercise prescription builds to 30-45 minutes a day of aerobic activity, 2-3 days per week.  Home exercise guidelines will be given to patient during program as part of exercise prescription that the participant will acknowledge.  Education: Aerobic Exercise: - Group verbal and visual presentation on the components of exercise prescription. Introduces F.I.T.T principle from ACSM for exercise prescriptions.  Reviews F.I.T.T. principles of aerobic exercise including progression. Written material given at  graduation.   Education: Resistance Exercise: - Group verbal and visual presentation on the components of exercise prescription. Introduces F.I.T.T principle from ACSM for exercise prescriptions  Reviews F.I.T.T. principles of resistance exercise including progression. Written material given at graduation.    Education: Exercise & Equipment Safety: - Individual verbal instruction and demonstration of equipment use and safety with use of the equipment. Flowsheet Row Pulmonary Rehab from 05/13/2023 in [facility] Cardiac and Pulmonary Rehab  Date 05/13/23  Educator [facility]  Instruction Review Code 1- Verbalizes Understanding       Education: Exercise Physiology & General Exercise Guidelines: -  Group verbal and written instruction with models to review the exercise physiology of the cardiovascular system and associated critical values. Provides general exercise guidelines with specific guidelines to those with heart or lung disease.    Education: Flexibility, Balance, Mind/Body Relaxation: - Group verbal and visual presentation with interactive activity on the components of exercise prescription. Introduces F.I.T.T principle from ACSM for exercise prescriptions. Reviews F.I.T.T. principles of flexibility and balance exercise training including progression. Also discusses the mind body connection.  Reviews various relaxation techniques to help reduce and manage stress (i.e. Deep breathing, progressive muscle relaxation, and visualization). Balance handout provided to take home. Written material given at graduation.   Activity Barriers & Risk Stratification:   6 Minute Walk:  6 Minute Walk     Row Name 05/20/23 1019         6 Minute Walk   Phase Initial     Distance 1125 feet     Walk Time 6 minutes     # of Rest Breaks 0     MPH 2.13     METS 2.99     RPE 9     Perceived Dyspnea  2     VO2 Peak 10.45     Symptoms No     Resting HR 61 bpm     Resting BP 128/72     Resting Oxygen  Saturation  98 %     Exercise Oxygen Saturation  during 6 min walk 93 %     Max Ex. HR 101 bpm     Max Ex. BP 146/76     2 Minute Post BP 126/70       Interval HR   1 Minute HR 87     2 Minute HR 96     3 Minute HR 100     4 Minute HR 96     5 Minute HR 99     6 Minute HR 101     2 Minute Post HR 68     Interval Heart Rate? Yes       Interval Oxygen   Interval Oxygen? Yes     Baseline Oxygen Saturation % 98 %     1 Minute Oxygen Saturation % 97 %     1 Minute Liters of Oxygen 0 L  RA     2 Minute Oxygen Saturation % 96 %     2 Minute Liters of Oxygen 0 L     3 Minute Oxygen Saturation % 95 %     3 Minute Liters of Oxygen 0 L     4 Minute Oxygen Saturation % 95 %     4 Minute Liters of Oxygen 0 L     5 Minute Oxygen Saturation % 97 %     5 Minute Liters of Oxygen 0 L     6 Minute Oxygen Saturation % 93 %     6 Minute Liters of Oxygen 0 L     2 Minute Post Oxygen Saturation % 97 %     2 Minute Post Liters of Oxygen 0 L             Oxygen Initial Assessment:  Oxygen Initial Assessment - 05/13/23 0941       Home Oxygen   Home Oxygen Device Home Concentrator    Sleep Oxygen Prescription Continuous    Liters per minute 2    Home Exercise Oxygen Prescription None    Home Resting Oxygen Prescription None  Compliance with Home Oxygen Use Yes      Initial 6 min Walk   Oxygen Used None      Program Oxygen Prescription   Program Oxygen Prescription None      Intervention   Short Term Goals To learn and exhibit compliance with exercise, home and travel O2 prescription;To learn and understand importance of monitoring SPO2 with pulse oximeter and demonstrate accurate use of the pulse oximeter.;To learn and understand importance of maintaining oxygen saturations>88%;To learn and demonstrate proper pursed lip breathing techniques or other breathing techniques. ;To learn and demonstrate proper use of respiratory medications    Long  Term Goals Exhibits compliance with  exercise, home  and travel O2 prescription;Verbalizes importance of monitoring SPO2 with pulse oximeter and return demonstration;Maintenance of O2 saturations>88%;Exhibits proper breathing techniques, such as pursed lip breathing or other method taught during program session;Compliance with respiratory medication;Demonstrates proper use of MDI's             Oxygen Re-Evaluation:  Oxygen Re-Evaluation     Row Name 05/22/23 0932             Goals/Expected Outcomes   Comments Reviewed PLB technique with pt.  Talked about how it works and it's importance in maintaining their exercise saturations.       Goals/Expected Outcomes Short: Become more profiecient at using PLB. Long: Become independent at using PLB.                Oxygen Discharge (Final Oxygen Re-Evaluation):  Oxygen Re-Evaluation - 05/22/23 0932       Goals/Expected Outcomes   Comments Reviewed PLB technique with pt.  Talked about how it works and it's importance in maintaining their exercise saturations.    Goals/Expected Outcomes Short: Become more profiecient at using PLB. Long: Become independent at using PLB.             Initial Exercise Prescription:  Initial Exercise Prescription - 05/20/23 1100       Date of Initial Exercise RX and Referring Provider   Date 05/20/23    Referring Provider Dr. Sarina Ser MD      Oxygen   Maintain Oxygen Saturation 88% or higher      Recumbant Bike   Level 1    RPM 50    Watts 25    Minutes 15    METs 2.99      NuStep   Level 2    SPM 80    Minutes 15    METs 2.99      T5 Nustep   Level 1    SPM 80    Minutes 15    METs 2.99      Track   Laps 30    Minutes 15    METs 2.63      Prescription Details   Frequency (times per week) 2    Duration Progress to 30 minutes of continuous aerobic without signs/symptoms of physical distress      Intensity   THRR 40-80% of Max Heartrate 96-132    Ratings of Perceived Exertion 11-13    Perceived  Dyspnea 0-4      Progression   Progression Continue to progress workloads to maintain intensity without signs/symptoms of physical distress.      Resistance Training   Training Prescription Yes    Weight 2 lb    Reps 10-15             Perform Capillary Blood Glucose checks as needed.  Exercise Prescription Changes:   Exercise Prescription Changes     Row Name 05/20/23 1100 05/28/23 1500           Response to Exercise   Blood Pressure (Admit) 128/72 132/60      Blood Pressure (Exercise) 146/76 136/62      Blood Pressure (Exit) 126/70 104/64      Heart Rate (Admit) 61 bpm 68 bpm      Heart Rate (Exercise) 101 bpm 107 bpm      Heart Rate (Exit) 68 bpm 83 bpm      Oxygen Saturation (Admit) 98 % 94 %      Oxygen Saturation (Exercise) 93 % 92 %      Oxygen Saturation (Exit) 97 % 96 %      Rating of Perceived Exertion (Exercise) 9 13      Perceived Dyspnea (Exercise) 2 3      Symptoms None none      Comments Results First 2 weeks of exercise      Duration -- Progress to 30 minutes of  aerobic without signs/symptoms of physical distress      Intensity -- THRR unchanged        Progression   Progression -- Continue to progress workloads to maintain intensity without signs/symptoms of physical distress.      Average METs -- 1.98        Resistance Training   Training Prescription -- Yes      Weight -- 2 lb      Reps -- 10-15        Interval Training   Interval Training -- No        Treadmill   MPH -- 1.5      Grade -- 0      Minutes -- 15      METs -- 2.15        NuStep   Level -- 1      Minutes -- 15      METs -- 1.8        Oxygen   Maintain Oxygen Saturation -- 88% or higher               Exercise Comments:   Exercise Comments     Row Name 05/22/23 0931           Exercise Comments First full day of exercise!  Patient was oriented to gym and equipment including functions, settings, policies, and procedures.  Patient's individual exercise  prescription and treatment plan were reviewed.  All starting workloads were established based on the results of the 6 minute walk test done at initial orientation visit.  The plan for exercise progression was also introduced and progression will be customized based on patient's performance and goals.                Exercise Goals and Review:   Exercise Goals     Row Name 05/20/23 1155             Exercise Goals   Increase Physical Activity Yes       Intervention Provide advice, education, support and counseling about physical activity/exercise needs.;Develop an individualized exercise prescription for aerobic and resistive training based on initial evaluation findings, risk stratification, comorbidities and participant's personal goals.       Expected Outcomes Long Term: Exercising regularly at least 3-5 days a week.;Long Term: Add in home exercise to make exercise part of routine and to increase amount of physical activity.;Short  Term: Attend rehab on a regular basis to increase amount of physical activity.       Increase Strength and Stamina Yes       Intervention Provide advice, education, support and counseling about physical activity/exercise needs.;Develop an individualized exercise prescription for aerobic and resistive training based on initial evaluation findings, risk stratification, comorbidities and participant's personal goals.       Expected Outcomes Long Term: Improve cardiorespiratory fitness, muscular endurance and strength as measured by increased METs and functional capacity ( );Short Term: Perform resistance training exercises routinely during rehab and add in resistance training at home;Short Term: Increase workloads from initial exercise prescription for resistance, speed, and METs.       Able to understand and use rate of perceived exertion (RPE) scale Yes       Intervention Provide education and explanation on how to use RPE scale       Expected Outcomes Short  Term: Able to use RPE daily in rehab to express subjective intensity level;Long Term:  Able to use RPE to guide intensity level when exercising independently       Able to understand and use Dyspnea scale Yes       Intervention Provide education and explanation on how to use Dyspnea scale       Expected Outcomes Short Term: Able to use Dyspnea scale daily in rehab to express subjective sense of shortness of breath during exertion;Long Term: Able to use Dyspnea scale to guide intensity level when exercising independently       Knowledge and understanding of Target Heart Rate Range (THRR) Yes       Intervention Provide education and explanation of THRR including how the numbers were predicted and where they are located for reference       Expected Outcomes Short Term: Able to state/look up THRR;Long Term: Able to use THRR to govern intensity when exercising independently;Short Term: Able to use daily as guideline for intensity in rehab       Able to check pulse independently Yes       Intervention Provide education and demonstration on how to check pulse in carotid and radial arteries.;Review the importance of being able to check your own pulse for safety during independent exercise       Expected Outcomes Short Term: Able to explain why pulse checking is important during independent exercise;Long Term: Able to check pulse independently and accurately       Understanding of Exercise Prescription Yes       Intervention Provide education, explanation, and written materials on patient's individual exercise prescription       Expected Outcomes Short Term: Able to explain program exercise prescription;Long Term: Able to explain home exercise prescription to exercise independently                Exercise Goals Re-Evaluation :  Exercise Goals Re-Evaluation     Row Name 05/22/23 0931 05/22/23 0932 05/28/23 1511         Exercise Goal Re-Evaluation   Exercise Goals Review Able to understand and use  rate of perceived exertion (RPE) scale;Able to understand and use Dyspnea scale;Knowledge and understanding of Target Heart Rate Range (THRR);Understanding of Exercise Prescription -- Increase Physical Activity;Increase Strength and Stamina;Understanding of Exercise Prescription     Comments Reviewed PLB technique with pt.  Talked about how it works and it's importance in maintaining their exercise saturations. Reviewed RPE and dyspnea scale, THR and program prescription with pt today.  Pt voiced understanding and  was given a copy of goals to take home. [name] is off to a good start in the program. She was only able to attend one session since her orientation . She was able to use the T4 nustep at level 1, and the treadmill at a speed of 1.5 mph and no incline. We will continue to monitor her progress in the program.     Expected Outcomes Short: Become more profiecient at using PLB. Long: Become independent at using PLB. Short: Use RPE daily to regulate intensity. Long: Follow program prescription in THR. Short: Continue to follow current exercise prescription. Long: Continue exercise to improve strength and stamina.              Discharge Exercise Prescription (Final Exercise Prescription Changes):  Exercise Prescription Changes - 05/28/23 1500       Response to Exercise   Blood Pressure (Admit) 132/60    Blood Pressure (Exercise) 136/62    Blood Pressure (Exit) 104/64    Heart Rate (Admit) 68 bpm    Heart Rate (Exercise) 107 bpm    Heart Rate (Exit) 83 bpm    Oxygen Saturation (Admit) 94 %    Oxygen Saturation (Exercise) 92 %    Oxygen Saturation (Exit) 96 %    Rating of Perceived Exertion (Exercise) 13    Perceived Dyspnea (Exercise) 3    Symptoms none    Comments First 2 weeks of exercise    Duration Progress to 30 minutes of  aerobic without signs/symptoms of physical distress    Intensity THRR unchanged      Progression   Progression Continue to progress workloads to maintain  intensity without signs/symptoms of physical distress.    Average METs 1.98      Resistance Training   Training Prescription Yes    Weight 2 lb    Reps 10-15      Interval Training   Interval Training No      Treadmill   MPH 1.5    Grade 0    Minutes 15    METs 2.15      NuStep   Level 1    Minutes 15    METs 1.8      Oxygen   Maintain Oxygen Saturation 88% or higher             Nutrition:  Target Goals: Understanding of nutrition guidelines, daily intake of sodium 1500mg , cholesterol 200mg , calories 30% from fat and 7% or less from saturated fats, daily to have 5 or more servings of fruits and vegetables.  Education: All About Nutrition: -Group instruction provided by verbal, written material, interactive activities, discussions, models, and posters to present general guidelines for heart healthy nutrition including fat, fiber, MyPlate, the role of sodium in heart healthy nutrition, utilization of the nutrition label, and utilization of this knowledge for meal planning. Follow up email sent as well. Written material given at graduation.   Biometrics:  Pre Biometrics - 05/20/23 1157       Pre Biometrics   Height 5\' 4"  (1.626 m)    Weight 124 lb 1.6 oz (56.3 kg)    Waist Circumference 30 inches    Hip Circumference 37 inches    Waist to Hip Ratio 0.81 %    BMI (Calculated) 21.29    Single Leg Stand 12.1 seconds              Nutrition Therapy Plan and Nutrition Goals:  Nutrition Therapy & Goals - 05/20/23 1327  Nutrition Therapy   Diet Cardiac    Protein (specify units) 75    Fiber 25 grams    Whole Grain Foods 3 servings    Saturated Fats 15 max. grams    Fruits and Vegetables 5 servings/day    Sodium 2 grams      Personal Nutrition Goals   Nutrition Goal Include healthy fats and protein with a carb at each meals    Personal Goal #2 Eat 2 good meals and a calorie dense snacks each day    Personal Goal #3 Look into greek yogurt and  protein shake items    Comments Patient drinking around 60oz of water daily. She has been losing weight for the past few months. Reports she is down ~20lbs. She has questions around adequate protein intake and maintaining her weight. Reviewed 24hr food recall. She doesn't eat dinner, her husband is paralyzed and goes to bed around 4:30pm. She has Reflux and doesn't like to eat dinner when he isn't eating or late in the evening. Spoke to her about nutrient and calorie dense foods, choosing balanced meals with healthy fats to help her eat more calories with less quantity of food. Also provided some ideas for calorie dense snacks she could eat in the evening to help her meet her nutritional goals without causing reflux. Built out several meals and snacks with foods she likes and will eat.      Intervention Plan   Intervention Prescribe, educate and counsel regarding individualized specific dietary modifications aiming towards targeted core components such as weight, hypertension, lipid management, diabetes, heart failure and other comorbidities.;Nutrition handout(s) given to patient.    Expected Outcomes Short Term Goal: Understand basic principles of dietary content, such as calories, fat, sodium, cholesterol and nutrients.;Short Term Goal: A plan has been developed with personal nutrition goals set during dietitian appointment.;Long Term Goal: Adherence to prescribed nutrition plan.             Nutrition Assessments:  MEDIFICTS Score Key: >=70 Need to make dietary changes  40-70 Heart Healthy Diet <= 40 Therapeutic Level Cholesterol Diet  Flowsheet Row Pulmonary Rehab from 05/22/2023 in [facility] Cardiac and Pulmonary Rehab  Picture Your Plate Total Score on Admission 58      Picture Your Plate Scores: <16 Unhealthy dietary pattern with much room for improvement. 41-50 Dietary pattern unlikely to meet recommendations for good health and room for improvement. 51-60 More healthful dietary pattern,  with some room for improvement.  >60 Healthy dietary pattern, although there may be some specific behaviors that could be improved.   Nutrition Goals Re-Evaluation:   Nutrition Goals Discharge (Final Nutrition Goals Re-Evaluation):   Psychosocial: Target Goals: Acknowledge presence or absence of significant depression and/or stress, maximize coping skills, provide positive support system. Participant is able to verbalize types and ability to use techniques and skills needed for reducing stress and depression.   Education: Stress, Anxiety, and Depression - Group verbal and visual presentation to define topics covered.  Reviews how body is impacted by stress, anxiety, and depression.  Also discusses healthy ways to reduce stress and to treat/manage anxiety and depression.  Written material given at graduation.   Education: Sleep Hygiene -Provides group verbal and written instruction about how sleep can affect your health.  Define sleep hygiene, discuss sleep cycles and impact of sleep habits. Review good sleep hygiene tips.    Initial Review & Psychosocial Screening:  Initial Psych Review & Screening - 05/13/23 1096  Initial Review   Current issues with None Identified      Family Dynamics   Good Support System? Yes    Comments She can look to her husband and sons for support. She has had some anxiety and panic in the past but no longer feels that way.      Barriers   Psychosocial barriers to participate in program The patient should benefit from training in stress management and relaxation.;There are no identifiable barriers or psychosocial needs.      Screening Interventions   Interventions Encouraged to exercise;Program counselor consult;To provide support and resources with identified psychosocial needs;Provide feedback about the scores to participant    Expected Outcomes Short Term goal: Utilizing psychosocial counselor, staff and physician to assist with identification of  specific Stressors or current issues interfering with healing process. Setting desired goal for each stressor or current issue identified.;Long Term Goal: Stressors or current issues are controlled or eliminated.;Short Term goal: Identification and review with participant of any Quality of Life or Depression concerns found by scoring the questionnaire.;Long Term goal: The participant improves quality of Life and PHQ9 Scores as seen by post scores and/or verbalization of changes             Quality of Life Scores:  Scores of 19 and below usually indicate a poorer quality of life in these areas.  A difference of  2-3 points is a clinically meaningful difference.  A difference of 2-3 points in the total score of the Quality of Life Index has been associated with significant improvement in overall quality of life, self-image, physical symptoms, and general health in studies assessing change in quality of life.  PHQ-9: Review Flowsheet       05/20/2023  Depression screen PHQ 2/9  Decreased Interest 1  Down, Depressed, Hopeless 1  PHQ - 2 Score 2  Altered sleeping 2  Tired, decreased energy 2  Change in appetite 1  Feeling bad or failure about yourself  0  Trouble concentrating 0  Moving slowly or fidgety/restless 1  Suicidal thoughts 0  PHQ-9 Score 8  Difficult doing work/chores Somewhat difficult    Details           Interpretation of Total Score  Total Score Depression Severity:  1-4 = Minimal depression, 5-9 = Mild depression, 10-14 = Moderate depression, 15-19 = Moderately severe depression, 20-27 = Severe depression   Psychosocial Evaluation and Intervention:  Psychosocial Evaluation - 05/13/23 0945       Psychosocial Evaluation & Interventions   Interventions Stress management education;Relaxation education;Encouraged to exercise with the program and follow exercise prescription    Comments She can look to her husband and sons for support. She has had some anxiety and  panic in the past but no longer feels that way.    Expected Outcomes Short: Start [facility] to help with mood. Long: Maintain a healthy mental state.    Continue Psychosocial Services  Follow up required by staff             Psychosocial Re-Evaluation:   Psychosocial Discharge (Final Psychosocial Re-Evaluation):   Education: Education Goals: Education classes will be provided on a weekly basis, covering required topics. Participant will state understanding/return demonstration of topics presented.  Learning Barriers/Preferences:  Learning Barriers/Preferences - 05/13/23 0943       Learning Barriers/Preferences   Learning Barriers None    Learning Preferences None             General Pulmonary Education Topics:  Infection Prevention: - Provides verbal and written material to individual with discussion of infection control including proper hand washing and proper equipment cleaning during exercise session. Flowsheet Row Pulmonary Rehab from 05/13/2023 in [facility] Cardiac and Pulmonary Rehab  Date 05/13/23  Educator [facility]  Instruction Review Code 1- Verbalizes Understanding       Falls Prevention: - Provides verbal and written material to individual with discussion of falls prevention and safety. Flowsheet Row Pulmonary Rehab from 05/13/2023 in [facility] Cardiac and Pulmonary Rehab  Date 05/13/23  Educator [facility]  Instruction Review Code 1- Verbalizes Understanding       Chronic Lung Disease Review: - Group verbal instruction with posters, models, PowerPoint presentations and videos,  to review new updates, new respiratory medications, new advancements in procedures and treatments. Providing information on websites and "800" numbers for continued self-education. Includes information about supplement oxygen, available portable oxygen systems, continuous and intermittent flow rates, oxygen safety, concentrators, and Medicare reimbursement for oxygen. Explanation of Pulmonary Drugs,  including class, frequency, complications, importance of spacers, rinsing mouth after steroid MDI's, and proper cleaning methods for nebulizers. Review of basic lung anatomy and physiology related to function, structure, and complications of lung disease. Review of risk factors. Discussion about methods for diagnosing sleep apnea and types of masks and machines for OSA. Includes a review of the use of types of environmental controls: home humidity, furnaces, filters, dust mite/pet prevention, HEPA vacuums. Discussion about weather changes, air quality and the benefits of nasal washing. Instruction on Warning signs, infection symptoms, calling MD promptly, preventive modes, and value of vaccinations. Review of effective airway clearance, coughing and/or vibration techniques. Emphasizing that all should Create an Action Plan. Written material given at graduation.   AED/CPR: - Group verbal and written instruction with the use of models to demonstrate the basic use of the AED with the basic ABC's of resuscitation.    Anatomy and Cardiac Procedures: - Group verbal and visual presentation and models provide information about basic cardiac anatomy and function. Reviews the testing methods done to diagnose heart disease and the outcomes of the test results. Describes the treatment choices: Medical Management, Angioplasty, or Coronary Bypass Surgery for treating various heart conditions including Myocardial Infarction, Angina, Valve Disease, and Cardiac Arrhythmias.  Written material given at graduation.   Medication Safety: - Group verbal and visual instruction to review commonly prescribed medications for heart and lung disease. Reviews the medication, class of the drug, and side effects. Includes the steps to properly store meds and maintain the prescription regimen.  Written material given at graduation.   Other: -Provides group and verbal instruction on various topics (see comments)   Knowledge  Questionnaire Score:  Knowledge Questionnaire Score - 05/22/23 0930       Knowledge Questionnaire Score   Pre Score 15/18              Core Components/Risk Factors/Patient Goals at Admission:  Personal Goals and Risk Factors at Admission - 05/13/23 0943       Core Components/Risk Factors/Patient Goals on Admission    Weight Management Yes;Weight Maintenance    Intervention Weight Management: Develop a combined nutrition and exercise program designed to reach desired caloric intake, while maintaining appropriate intake of nutrient and fiber, sodium and fats, and appropriate energy expenditure required for the weight goal.;Weight Management: Provide education and appropriate resources to help participant work on and attain dietary goals.;Weight Management/Obesity: Establish reasonable short term and long term weight goals.    Expected Outcomes Short Term: Continue  to assess and modify interventions until short term weight is achieved;Long Term: Adherence to nutrition and physical activity/exercise program aimed toward attainment of established weight goal;Weight Maintenance: Understanding of the daily nutrition guidelines, which includes 25-35% calories from fat, 7% or less cal from saturated fats, less than 200mg  cholesterol, less than 1.5gm of sodium, & 5 or more servings of fruits and vegetables daily;Understanding recommendations for meals to include 15-35% energy as protein, 25-35% energy from fat, 35-60% energy from carbohydrates, less than 200mg  of dietary cholesterol, 20-35 gm of total fiber daily;Understanding of distribution of calorie intake throughout the day with the consumption of 4-5 meals/snacks    Improve shortness of breath with ADL's Yes    Intervention Provide education, individualized exercise plan and daily activity instruction to help decrease symptoms of SOB with activities of daily living.    Expected Outcomes Short Term: Improve cardiorespiratory fitness to achieve a  reduction of symptoms when performing ADLs;Long Term: Be able to perform more ADLs without symptoms or delay the onset of symptoms    Lipids Yes    Intervention Provide education and support for participant on nutrition & aerobic/resistive exercise along with prescribed medications to achieve LDL 70mg , HDL >40mg .    Expected Outcomes Short Term: Participant states understanding of desired cholesterol values and is compliant with medications prescribed. Participant is following exercise prescription and nutrition guidelines.;Long Term: Cholesterol controlled with medications as prescribed, with individualized exercise RX and with personalized nutrition plan. Value goals: LDL < 70mg , HDL > 40 mg.             Education:Diabetes - Individual verbal and written instruction to review signs/symptoms of diabetes, desired ranges of glucose level fasting, after meals and with exercise. Acknowledge that pre and post exercise glucose checks will be done for 3 sessions at entry of program.   Know Your Numbers and Heart Failure: - Group verbal and visual instruction to discuss disease risk factors for cardiac and pulmonary disease and treatment options.  Reviews associated critical values for Overweight/Obesity, Hypertension, Cholesterol, and Diabetes.  Discusses basics of heart failure: signs/symptoms and treatments.  Introduces Heart Failure Zone chart for action plan for heart failure.  Written material given at graduation.   Core Components/Risk Factors/Patient Goals Review:    Core Components/Risk Factors/Patient Goals at Discharge (Final Review):    ITP Comments:  ITP Comments     Row Name 05/13/23 0941 05/20/23 1019 05/22/23 0930       ITP Comments Virtual Visit completed. Patient informed on EP and RD appointment and 6 Minute walk test. Patient also informed of patient health questionnaires on My Chart. Patient Verbalizes understanding. Visit diagnosis can be found in [facility] 03/26/2023.  Completed and gym orientation. Initial ITP created and sent for review to Dr. Jinny Sanders, Medical Director. First full day of exercise!  Patient was oriented to gym and equipment including functions, settings, policies, and procedures.  Patient's individual exercise prescription and treatment plan were reviewed.  All starting workloads were established based on the results of the 6 minute walk test done at initial orientation visit.  The plan for exercise progression was also introduced and progression will be customized based on patient's performance and goals.              Comments: Discharge ITP

## 2023-07-01 ENCOUNTER — Ambulatory Visit: Payer: Medicare Other | Admitting: Pulmonary Disease

## 2023-07-01 ENCOUNTER — Encounter: Payer: Self-pay | Admitting: Pulmonary Disease

## 2023-07-01 VITALS — BP 130/80 | HR 87 | Temp 97.7°F | Ht 64.0 in | Wt 124.4 lb

## 2023-07-01 DIAGNOSIS — G4734 Idiopathic sleep related nonobstructive alveolar hypoventilation: Secondary | ICD-10-CM

## 2023-07-01 DIAGNOSIS — R053 Chronic cough: Secondary | ICD-10-CM

## 2023-07-01 DIAGNOSIS — R0602 Shortness of breath: Secondary | ICD-10-CM

## 2023-07-01 DIAGNOSIS — J84112 Idiopathic pulmonary fibrosis: Secondary | ICD-10-CM | POA: Diagnosis not present

## 2023-07-01 DIAGNOSIS — K219 Gastro-esophageal reflux disease without esophagitis: Secondary | ICD-10-CM

## 2023-07-01 MED ORDER — BENZONATATE 200 MG PO CAPS: 200.0000 mg | ORAL_CAPSULE | Freq: Three times a day (TID) | ORAL | 3 refills | Status: DC | PRN

## 2023-07-01 NOTE — Patient Instructions (Signed)
VISIT SUMMARY:  During today's visit, we discussed your ongoing management of pulmonary fibrosis, chronic cough, and gastroesophageal reflux disease (GERD). We reviewed your current medications and made some adjustments to better manage your symptoms. We also talked about the importance of continuing pulmonary rehabilitation when possible and addressed your recent upper respiratory infection.  YOUR PLAN:  -PULMONARY FIBROSIS: Pulmonary fibrosis is a chronic lung disease that causes scarring of the lung tissue, making it difficult to breathe. You are currently using albuterol as needed and Breztri, which provides some relief. We discussed the benefits of pulmonary rehabilitation, which you enjoyed but had to stop due to illness and personal reasons. It is important to continue pulmonary rehab when possible. We will monitor your oxygen levels during a walking test, and you should continue using albuterol and Breztri as prescribed.  -CHRONIC COUGH: Chronic cough is a persistent cough that can be caused by various factors, including GERD and respiratory conditions. You are managing it with hydrocodone cough medicine at night and Tessalon Perles. You had an allergic reaction to Delsym, so we will send a new prescription for [facility], which you prefer.  -GASTROESOPHAGEAL REFLUX DISEASE (GERD): GERD is a chronic condition where stomach acid frequently flows back into the esophagus, causing irritation. This can exacerbate your pulmonary symptoms. You should continue taking Nexium and Pepcid daily to manage your reflux.  -GENERAL HEALTH MAINTENANCE: You should continue using oxygen at night as it helps manage your pulmonary fibrosis. We will also check your oxygen level during your next visit.  INSTRUCTIONS:  Please follow up in 3 months' time or as needed.

## 2023-07-01 NOTE — Progress Notes (Signed)
Subjective:    Patient ID: Cheyenne Wong, female    DOB: 1951-04-30, 72 y.o.   MRN: [id number]  Patient Care Team: Lynnea Ferrier, MD as PCP - General (Internal Medicine) Salena Saner, MD as Consulting Physician (Pulmonary Disease)  Chief Complaint  Patient presents with   Follow-up    SOB. Little wheezing. Dry cough.    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  This is a scheduled visit.  Last visit here was on 26 March 2023.  Most recent PFTs, chest CT and echocardiogram showed no progression of disease and echocardiogram did not show evidence of pulmonary hypertension.  She is on nocturnal oxygen for nocturnal hypoxemia and notes benefit from the therapy.    HPI Discussed the use of AI scribe software for clinical note transcription with the patient, who gave verbal consent to proceed.  History of Present Illness   The patient, diagnosed with pulmonary fibrosis, had initiated pulmonary rehabilitation and reported a positive experience. However, she had to discontinue due to an upper respiratory infection and personal circumstances. She had previously tried nintedanib and pirfenidone for her condition but was unable to tolerate them. She has since been placed back on albuterol as needed.  They which she notes is helpful in controlling her cough.  The patient also suffers from reflux, for which she takes Nexium and Pepcid daily. She experiences a sudden onset cough, which she manages with hydrocodone cough medicine at night and cough drops. She had a negative reaction to Delsym, which she described as nearly 'shutting her down.' She has found some relief with Jerilynn Som, although her current supply is old.  The patient uses oxygen at night. She also reported significant stress due to her spouse's health issues, including suspected septic pneumonia and a UTI. The spouse had been  running a fever for six days at the time of the consultation.     THERAPIES tried and failed: Esbriet (pirfenidone) - d/c'd due to "side effects" + cost Ofev (nintendanib) - nausea,diarrhea, legs felt tight Has declined Pulmonary Rehab previously Declined research protocols   DATA 10/24/2021 overnight oximetry: Qualified for O2 with significant desaturations overnight as low as 84%. 10/26/2021 CT chest high-resolution: Slightly worsened fibrotic changes and honeycombing from prior CT of 11 January 2020, findings consistent with UIP. 12/13/2021 PFTs: FEV1 1.50 L or 67% predicted, FVC 1.87 L or 63% predicted, FEV1/FVC 76%. Lung volumes moderately reduced, there is a small airways component.  Diffusion capacity moderately reduced.  Compared to PFTs performed at [facility] clinic in April 2022 there has been slight decline in function. 07/20/2022 chest x-ray: Redemonstration of fibrosis/interstitial lung disease with no definitive acute superimposed disease. 10/26/2021 high-resolution CT chest: moderate pulmonary fibrosis in a pattern apical to basal gradient.  Fibrotic findings slightly worsened in comparison to prior examination of 11 January 2020.  Findings consistent with UIP. 02/19/2023 CT chest, high-resolution: Stable fibrotic changes in the lungs, categorized as diagnostic of usual interstitial pneumonia (UIP). 02/19/2023 PFTs: FEV1 1.35 L or 61% of predicted, FVC 1.78 L or 61% predicted, FEV1/FVC 76%, lung volumes moderately reduced.  Mild diffusion defect.  Overall no significant change from prior. 04/16/2023 echocardiogram: LVEF 55 to 60%, diastolic parameters are normal.  No wall motion abnormalities.  Mild calcification of mitral valve leaflets moderate mitral annular ossification.  Trivial mitral valve regurgitation.  No mitral valve stenosis.  Review of Systems A 10 point review of systems was performed  and it is as noted above otherwise negative.   Patient Active Problem List   Diagnosis  Date Noted   GERD (gastroesophageal reflux disease) 11/16/2021   Prediabetes 07/29/2019   Other dysphagia 04/01/2017   Pulmonary fibrosis (HCC) 10/15/2016   Need for hepatitis C screening test 06/02/2015   Screening for breast cancer 04/29/2015   Hypothyroidism 08/26/2012   Vitamin D deficiency 10/02/2011   Hyperlipidemia 09/04/2011    Social History   Tobacco Use   Smoking status: Never   Smokeless tobacco: Never  Substance Use Topics   Alcohol use: No    Allergies  Allergen Reactions   Peanut Butter Flavoring Agent (Non-Screening) Shortness Of Breath    Peanut Butter   Tramadol Shortness Of Breath   Walnut Shortness Of Breath   Penicillins Rash    Allergy as a child    Current Meds  Medication Sig   albuterol (VENTOLIN HFA) 108 (90 Base) MCG/ACT inhaler Inhale 1 puff into the lungs every 6 (six) hours as needed.   atorvastatin (LIPITOR) 10 MG tablet Take 10 mg by mouth daily.   benzonatate (TESSALON) 200 MG capsule Take 1 capsule (200 mg total) by mouth 3 (three) times daily as needed for cough.   Budeson-Glycopyrrol-Formoterol (BREZTRI AEROSPHERE) 160-9-4.8 MCG/ACT AERO Inhale 2 puffs into the lungs in the morning and at bedtime.   chlorpheniramine-HYDROcodone (TUSSIONEX) 10-8 MG/5ML Take by mouth. Take 5 mLs by mouth every 12 (twelve) hours as needed for Cough   cholecalciferol (VITAMIN D) 25 MCG (1000 UNIT) tablet Take 2,000 Units by mouth daily.   cyanocobalamin (VITAMIN B12) 1000 MCG tablet Take 5,000 mcg by mouth once a week.   esomeprazole (NEXIUM) 40 MG capsule Take 40 mg by mouth daily at 12 noon.   famotidine (PEPCID) 20 MG tablet Take 20 mg by mouth 2 (two) times daily.   ibuprofen (ADVIL) 200 MG tablet Take 200 mg by mouth every 6 (six) hours as needed.   OXYGEN Inhale 2 L into the lungs at bedtime. And as needed    Immunization History  Administered Date(s) Administered   Influenza Inj Mdck Quad Pf 05/08/2018, 05/25/2019, 06/16/2020, 06/02/2021    Influenza Split 05/04/2011, 05/19/2012   Influenza,inj,Quad PF,6+ Mos 03/31/2013, 06/02/2015   Influenza,inj,quad, With Preservative 04/15/2016   Influenza-Unspecified 06/03/2017   Janssen (J&J) SARS-COV-2 Vaccination 10/23/2019   Moderna SARS-COV2 Booster Vaccination 06/24/2022   PFIZER Comirnaty(Gray Top)Covid-19 Tri-Sucrose Vaccine 05/23/2020   Pneumococcal Conjugate-13 06/20/2018   Pneumococcal Polysaccharide-23 07/16/2013, 07/29/2019   Tdap 07/16/2008, 07/28/2018   Zoster Recombinant(Shingrix) 12/13/2022, 04/08/2023        Objective:     BP 130/80 (BP Location: Right Arm, Cuff Size: Normal)   Pulse 87   Temp 97.7 F (36.5 C)   Ht 5\' 4"  (1.626 m)   Wt 124 lb 6.4 oz (56.4 kg)   SpO2 97%   BMI 21.35 kg/m   SpO2: 97 % O2 Device: None (Room air)  GENERAL: Well-developed, well-nourished woman, no acute distress.  Fully ambulatory, mild conversational dyspnea.  Mild tachypnea noted.  Occasionally will have dry cough. HEAD: Normocephalic, atraumatic.  EYES: Pupils equal, round, reactive to light.  No scleral icterus.  MOUTH: Oral mucosa moist.  No thrush, pharynx clear.  No angular cheilitis noted today. NECK: Supple. No thyromegaly. Trachea midline. No JVD.  No adenopathy. PULMONARY: Good air entry bilaterally.  No wheezes, faint pops and faint bibasilar crackles. CARDIOVASCULAR: S1 and S2. Regular rate and rhythm.  No rubs, murmurs or gallops heard. ABDOMEN:  Benign. MUSCULOSKELETAL: No joint deformity, no clubbing, no edema.  NEUROLOGIC: No overt focal deficit, no gait disturbance, speech is fluent. SKIN: Intact,warm,dry. PSYCH: Mood and behavior normal.     Ambulatory oxymetry was performed today:  At rest on room air oxygen saturation was 96%, the patient ambulated at a moderate pace, completed 3 laps, O2 nadir 96%, moderate shortness of breath.  Resting heart rate was 63 bpm at maximum for this exercise 107 bpm.  Patient's O2 INCREASED to 100% during the  exercise.   Assessment & Plan:     ICD-10-CM   1. 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 SOB (shortness of breath)  R06.02     3. Chronic cough  R05.3     4. Nocturnal hypoxemia  G47.34     5. Gastroesophageal reflux disease, unspecified whether esophagitis present  K21.9       No orders of the defined types were placed in this encounter.   Meds ordered this encounter  Medications   benzonatate (TESSALON) 200 MG capsule    Sig: Take 1 capsule (200 mg total) by mouth 3 (three) times daily as needed for cough.    Dispense:  20 capsule    Refill:  3   Discussion:    Pulmonary Fibrosis   Chronic condition with difficulty tolerating Ofev and other medications. Currently using albuterol PRN and Breztri, which provides some relief. Recent exacerbation with upper respiratory infection. Discussed benefits of pulmonary rehabilitation, which she enjoyed but had to stop due to illness and personal reasons. Emphasized importance of continuing pulmonary rehab when possible.   - Monitor oxygen levels during walking test -no desaturations noted - Continue albuterol PRN   - Continue Breztri   - Encourage resuming pulmonary rehabilitation when feasible    Chronic Cough   Intermittent cough managed with hydrocodone cough medicine at night and Tessalon Perles.  Adverse reaction to Delsym noted. Prefers [facility] over other treatments.   - Send new prescription for Tessalon Perles    Gastroesophageal Reflux Disease (GERD)   Chronic condition managed with Nexium and Pepcid. Reflux can exacerbate pulmonary symptoms.   - Continue Nexium daily   - Continue Pepcid daily    General Health Maintenance   Oxygen use at night confirmed.   - Continue using oxygen at night   - Patient declined flu vaccine  Follow-up   - 3 months time or as needed.      Advised if symptoms do not improve or worsen, to please contact office for sooner follow up or seek emergency  care.    I spent 32 minutes of dedicated to the care of this patient on the date of this encounter to include pre-visit review of records, face-to-face time with the patient discussing conditions above, post visit ordering of testing, clinical documentation with the electronic health record, making appropriate referrals as documented, and communicating necessary findings to members of the patients care team.     C. Danice Goltz, MD Advanced Bronchoscopy PCCM [facility]    *This note was generated using voice recognition software/Dragon and/or AI transcription program.  Despite best efforts to proofread, errors can occur which can change the meaning. Any transcriptional errors that result from this process are unintentional and may not be fully corrected at the time of dictation.

## 2023-07-18 ENCOUNTER — Encounter: Payer: Self-pay | Admitting: Pulmonary Disease

## 2023-07-18 DIAGNOSIS — G4734 Idiopathic sleep related nonobstructive alveolar hypoventilation: Secondary | ICD-10-CM

## 2023-07-18 NOTE — Telephone Encounter (Signed)
 Cheyenne Wong will you make sure her [employer] is put in the system so Cheyenne Wong will have it to send with the order? Let me know after so I can place the order. ThankYou!

## 2023-08-19 DIAGNOSIS — G4736 Sleep related hypoventilation in conditions classified elsewhere: Secondary | ICD-10-CM | POA: Diagnosis not present

## 2023-09-03 DIAGNOSIS — H43813 Vitreous degeneration, bilateral: Secondary | ICD-10-CM | POA: Diagnosis not present

## 2023-09-03 DIAGNOSIS — H40003 Preglaucoma, unspecified, bilateral: Secondary | ICD-10-CM | POA: Diagnosis not present

## 2023-09-03 DIAGNOSIS — Z961 Presence of intraocular lens: Secondary | ICD-10-CM | POA: Diagnosis not present

## 2023-09-13 ENCOUNTER — Other Ambulatory Visit: Payer: Self-pay

## 2023-09-13 MED ORDER — BREZTRI AEROSPHERE 160-9-4.8 MCG/ACT IN AERO: 2.0000 | INHALATION_SPRAY | Freq: Two times a day (BID) | RESPIRATORY_TRACT | 3 refills | Status: DC

## 2023-09-13 NOTE — Progress Notes (Signed)
 Received fax from [facility] asking for a refill on the patient's breztri.  I have sent in the script.  Nothing further needed.

## 2023-09-16 DIAGNOSIS — G4736 Sleep related hypoventilation in conditions classified elsewhere: Secondary | ICD-10-CM | POA: Diagnosis not present

## 2023-10-02 ENCOUNTER — Ambulatory Visit: Payer: Medicare Other | Admitting: Pulmonary Disease

## 2023-10-02 ENCOUNTER — Encounter: Payer: Self-pay | Admitting: Pulmonary Disease

## 2023-10-02 VITALS — BP 120/78 | HR 63 | Temp 97.1°F | Ht 64.0 in | Wt 122.8 lb

## 2023-10-02 DIAGNOSIS — G4734 Idiopathic sleep related nonobstructive alveolar hypoventilation: Secondary | ICD-10-CM

## 2023-10-02 DIAGNOSIS — J84112 Idiopathic pulmonary fibrosis: Secondary | ICD-10-CM

## 2023-10-02 DIAGNOSIS — K219 Gastro-esophageal reflux disease without esophagitis: Secondary | ICD-10-CM

## 2023-10-02 DIAGNOSIS — R0602 Shortness of breath: Secondary | ICD-10-CM

## 2023-10-02 DIAGNOSIS — R053 Chronic cough: Secondary | ICD-10-CM | POA: Diagnosis not present

## 2023-10-02 LAB — NITRIC OXIDE: Nitric Oxide: <5

## 2023-10-02 MED ORDER — PREDNISONE 10 MG (21) PO TBPK: ORAL_TABLET | ORAL | 0 refills | Status: DC

## 2023-10-02 MED ORDER — BENZONATATE 100 MG PO CAPS: 100.0000 mg | ORAL_CAPSULE | Freq: Three times a day (TID) | ORAL | 3 refills | Status: AC | PRN

## 2023-10-02 NOTE — Patient Instructions (Signed)
 VISIT SUMMARY:  Today, we discussed your ongoing symptoms related to pulmonary fibrosis, including shortness of breath, chronic cough, and recent dizziness. We also reviewed your current oxygen therapy and allergy management.  YOUR PLAN:  -PULMONARY FIBROSIS: Pulmonary fibrosis is a lung disease that occurs when lung tissue becomes damaged and scarred, leading to breathing difficulties. We will check your oxygen levels on your current therapy to ensure they are adequate, schedule a high-resolution CT scan when due, amend your prescription for benzonatate pearls to 100 mg, and consider another taper of prednisone to manage your symptoms. We will also check for allergies via a blood test to rule out any contribution to your symptoms.  -OXYGEN THERAPY: You are currently using oxygen therapy at night and have been approved for mobile oxygen, but your oxygen levels do not meet the criteria for Medicare coverage for daytime use. We will discuss with Medicare about the criteria for mobile oxygen approval.  -DIZZINESS: You have been experiencing intermittent dizziness, particularly when you are winded. This is not believed to be related to your pulmonary condition.  -ALLERGIES: You have allergies and are currently taking Claritin. Allergy season may be contributing to your symptoms. We will check for allergies via a blood test to rule out any contribution to your symptoms.  INSTRUCTIONS:  Please follow up with the recommended high-resolution CT scan when it is due. We will also need to check your oxygen levels on your current therapy and perform a blood test to check for allergies. Additionally, we will discuss with Medicare about the criteria for mobile oxygen approval.

## 2023-10-02 NOTE — Progress Notes (Signed)
 Subjective:    Patient ID: Cheyenne Wong, female    DOB: 1951-07-16, 73 y.o.   MRN: [id number]  Patient Care Team: Lynnea Ferrier, MD as PCP - General (Internal Medicine) Salena Saner, MD as Consulting Physician (Pulmonary Disease)  Chief Complaint  Patient presents with   Follow-up    Chills. Increased SOB for the past couple of weeks. No wheezing. Cough, dry.     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 This is a scheduled visit. Last visit here was on 01 July 2023. Most recent PFTs, chest CT and echocardiogram showed no progression of disease and echocardiogram did not show evidence of pulmonary hypertension. She is on nocturnal oxygen for nocturnal hypoxemia and notes benefit from the therapy.  Notes fairly well control of her cough with Breztri.  She does have mild airways reactivity as well.  HPI Discussed the use of AI scribe software for clinical note transcription with the patient, who gave verbal consent to proceed.  History of Present Illness   Cheyenne GRASSE "[name]" is a 73 year old female with pulmonary fibrosis who presents with dyspnea and chronic cough.  She experiences persistent dyspnea and chronic cough, both attributed to her pulmonary fibrosis. Despite increased use of her oxygen machine during the day, at night, and for short distance walks, she continues to experience shortness of breath. She also feels 'swimmy headed' for the past two weeks, particularly when she becomes winded. Her daily activities are significantly impacted as she needs to stop and rest frequently.  She describes significant pain in the lower part of her lungs radiating to her back. She is concerned about whether these symptoms are expected given her condition.  She has a history of intolerance to medications such as nintedanib (Ofev) and pirfenidone (Esbriet), which she felt worsened her  condition. She has requested an amendment to her prescription for Tessalon pearls to 100 mg instead of 200 mg.  She is currently using oxygen at night and has been approved for nocturnal oxygen. However, her oxygen levels do not meet the criteria for Medicare coverage of mobile oxygen.  She has a history of allergies and uses Claritin to manage her symptoms. She is 'full of allergies' and cannot use scented laundry detergents or soaps. She has been wearing a mask outdoors to mitigate allergy symptoms.   She continues to decline pulmonary rehab which I think would help her tremendously.  She has declined research protocols with regards to pulmonary fibrosis.     THERAPIES tried and failed: Esbriet (pirfenidone) - d/c'd due to "side effects" + cost Ofev (nintendanib) - nausea,diarrhea, legs felt tight Has declined Pulmonary Rehab  Declined research protocols  DATA 10/24/2021 overnight oximetry: Qualified for O2 with significant desaturations overnight as low as 84%. 10/26/2021 CT chest high-resolution: Slightly worsened fibrotic changes and honeycombing from prior CT of 11 January 2020, findings consistent with UIP. 12/13/2021 PFTs: FEV1 1.50 L or 67% predicted, FVC 1.87 L or 63% predicted, FEV1/FVC 76%. Lung volumes moderately reduced, there is a small airways component.  Diffusion capacity moderately reduced.  Compared to PFTs performed at [facility] clinic in April 2022 there has been slight decline in function. 07/20/2022 chest x-ray: Redemonstration of fibrosis/interstitial lung disease with no definitive acute superimposed disease. 10/26/2021 high-resolution CT chest: moderate pulmonary fibrosis in a pattern apical to basal gradient.  Fibrotic findings slightly worsened in comparison to prior examination of 11 January 2020.  Findings consistent  with UIP. 02/19/2023 CT chest, high-resolution: Stable fibrotic changes in the lungs, categorized as diagnostic of usual interstitial pneumonia  (UIP). 02/19/2023 PFTs: FEV1 1.35 L or 61% of predicted, FVC 1.78 L or 61% predicted, FEV1/FVC 76%, lung volumes moderately reduced.  Mild diffusion defect.  Overall no significant change from prior. 04/16/2023 echocardiogram: LVEF 55 to 60%, diastolic parameters are normal.  No wall motion abnormalities.  Mild calcification of mitral valve leaflets moderate mitral annular ossification.  Trivial mitral valve regurgitation.  No mitral valve stenosis.  Review of Systems A 10 point review of systems was performed and it is as noted above otherwise negative.   Patient Active Problem List   Diagnosis Date Noted   GERD (gastroesophageal reflux disease) 11/16/2021   Prediabetes 07/29/2019   Other dysphagia 04/01/2017   Pulmonary fibrosis (HCC) 10/15/2016   Need for hepatitis C screening test 06/02/2015   Screening for breast cancer 04/29/2015   Hypothyroidism 08/26/2012   Vitamin D deficiency 10/02/2011   Hyperlipidemia 09/04/2011    Social History   Tobacco Use   Smoking status: Never   Smokeless tobacco: Never  Substance Use Topics   Alcohol use: No    Allergies  Allergen Reactions   Peanut Butter Flavoring Agent (Non-Screening) Shortness Of Breath    Peanut Butter   Tramadol Shortness Of Breath   Walnut Shortness Of Breath   Penicillins Rash    Allergy as a child    Current Meds  Medication Sig   albuterol (VENTOLIN HFA) 108 (90 Base) MCG/ACT inhaler Inhale 1 puff into the lungs every 6 (six) hours as needed.   atorvastatin (LIPITOR) 10 MG tablet Take 10 mg by mouth daily.   Budeson-Glycopyrrol-Formoterol (BREZTRI AEROSPHERE) 160-9-4.8 MCG/ACT AERO Inhale 2 puffs into the lungs in the morning and at bedtime.   chlorpheniramine-HYDROcodone (TUSSIONEX) 10-8 MG/5ML Take by mouth. Take 5 mLs by mouth every 12 (twelve) hours as needed for Cough   cholecalciferol (VITAMIN D) 25 MCG (1000 UNIT) tablet Take 2,000 Units by mouth daily.   cyanocobalamin (VITAMIN B12) 1000 MCG tablet  Take 5,000 mcg by mouth once a week.   esomeprazole (NEXIUM) 40 MG capsule Take 40 mg by mouth daily at 12 noon.   famotidine (PEPCID) 20 MG tablet Take 20 mg by mouth 2 (two) times daily.   ibuprofen (ADVIL) 200 MG tablet Take 200 mg by mouth every 6 (six) hours as needed.   OXYGEN Inhale 2 L into the lungs at bedtime. And as needed    Immunization History  Administered Date(s) Administered   Influenza Inj Mdck Quad Pf 05/08/2018, 05/25/2019, 06/16/2020, 06/02/2021   Influenza Split 05/04/2011, 05/19/2012   Influenza,inj,Quad PF,6+ Mos 03/31/2013, 06/02/2015   Influenza,inj,quad, With Preservative 04/15/2016   Influenza-Unspecified 06/03/2017   Janssen (J&J) SARS-COV-2 Vaccination 10/23/2019   Moderna SARS-COV2 Booster Vaccination 06/24/2022   PFIZER Comirnaty(Gray Top)Covid-19 Tri-Sucrose Vaccine 05/23/2020   Pneumococcal Conjugate-13 06/20/2018   Pneumococcal Polysaccharide-23 07/16/2013, 07/29/2019   Tdap 07/16/2008, 07/28/2018   Zoster Recombinant(Shingrix) 12/13/2022, 04/08/2023        Objective:     BP 120/78 (BP Location: Right Arm, Cuff Size: Normal)   Pulse 63   Temp (!) 97.1 F (36.2 C)   Ht 5\' 4"  (1.626 m)   Wt 122 lb 12.8 oz (55.7 kg)   SpO2 100%   BMI 21.08 kg/m   SpO2: 100 % O2 Device: None (Room air)  GENERAL: Well-developed, well-nourished woman, no acute distress.  Fully ambulatory, mild conversational dyspnea.  Mild tachypnea noted.  Occasionally will have dry cough. HEAD: Normocephalic, atraumatic.  EYES: Pupils equal, round, reactive to light.  No scleral icterus.  MOUTH: Oral mucosa moist.  No thrush, pharynx clear.  No angular cheilitis(previously noted). NECK: Supple. No thyromegaly. Trachea midline. No JVD.  No adenopathy. PULMONARY: Good air entry bilaterally.  No wheezes, faint pops and faint bibasilar crackles. CARDIOVASCULAR: S1 and S2. Regular rate and rhythm.  No rubs, murmurs or gallops heard. ABDOMEN: Benign. MUSCULOSKELETAL: No joint  deformity, no clubbing, no edema.  NEUROLOGIC: No overt focal deficit, no gait disturbance, speech is fluent. SKIN: Intact,warm,dry. PSYCH: Mood and behavior normal.   Ambulatory oxymetry was performed today:  At rest on room air oxygen saturation was 98%, the patient ambulated at a moderate pace, completed 3 laps, O2 nadir 98%, severe shortness of breath.  Resting heart rate was 74 bpm at maximum for this exercise 97 bpm.   Lab Results  Component Value Date   NITRICOXIDE <5 10/02/2023  *No evidence of type II inflammation  Assessment & Plan:     ICD-10-CM   1. SOB (shortness of breath)  R06.02 Nitric oxide    2. 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 Nocturnal hypoxemia  G47.34 Overnight Pulse Oximetry Study    4. Chronic cough  R05.3     5. Gastroesophageal reflux disease, unspecified whether esophagitis present  K21.9      Orders Placed This Encounter  Procedures   Nitric oxide   Overnight Pulse Oximetry Study    Standing Status:   Future    Expiration Date:   10/01/2024    Scheduling Instructions:     Overnight oximetry on current nocturnal oxygen supplementation (2 L/min)   Meds ordered this encounter  Medications   benzonatate (TESSALON PERLES) 100 MG capsule    Sig: Take 1 capsule (100 mg total) by mouth 3 (three) times daily as needed for cough.    Dispense:  20 capsule    Refill:  3   predniSONE (STERAPRED UNI-PAK 21 TAB) 10 MG (21) TBPK tablet    Sig: Take as directed on the package.  This is a taper pack.    Dispense:  21 tablet    Refill:  0    Discussion:    Pulmonary Fibrosis Chronic pulmonary fibrosis with significant dyspnea and chronic cough. Increased use of oxygen therapy during the day and night, with shortness of breath on minimal exertion. Previous intolerance to antifibrotic medications (nintedanib and pirfenidone). Unable to participate in pulmonary rehabilitation due to caregiving  responsibilities. Experiences lower lung and back pain, possibly related to fibrosis. Oxygen levels are well-managed, but reassessment of oxygen requirements is needed. Allergy season may exacerbate symptoms. - Check oxygen level on current oxygen therapy to ensure adequacy. - Schedule a high-resolution CT scan when due. - Amend prescription for benzonatate pearls to 100 mg. - Consider another taper of prednisone to manage symptoms. - Check for allergies via blood test to rule out contribution to symptoms.  Oxygen Therapy Uses nocturnal oxygen therapy and approved for mobile oxygen, but does not meet criteria for daytime use per Medicare guidelines. Experiences drop in oxygen levels during ambulation, but not to extent required for mobile oxygen approval. - Discuss with Medicare about the criteria for mobile oxygen approval.  Dizziness Intermittent dizziness over the past two weeks, particularly when winded. Not believed to be related to pulmonary condition.  Allergies Allergies, currently taking Claritin. Allergy season may contribute to  symptoms. - Check for allergies via blood test to rule out contribution to symptoms.      Advised if symptoms do not improve or worsen, to please contact office for sooner follow up or seek emergency care.    I spent 40 minutes of dedicated to the care of this patient on the date of this encounter to include pre-visit review of records, face-to-face time with the patient discussing conditions above, post visit ordering of testing, clinical documentation with the electronic health record, making appropriate referrals as documented, and communicating necessary findings to members of the patients care team.   C. Danice Goltz, MD Advanced Bronchoscopy PCCM [facility]    *This note was generated using voice recognition software/Dragon and/or AI transcription program.  Despite best efforts to proofread, errors can occur which can change  the meaning. Any transcriptional errors that result from this process are unintentional and may not be fully corrected at the time of dictation.

## 2023-10-15 DIAGNOSIS — J8410 Pulmonary fibrosis, unspecified: Secondary | ICD-10-CM | POA: Diagnosis not present

## 2023-10-15 DIAGNOSIS — K219 Gastro-esophageal reflux disease without esophagitis: Secondary | ICD-10-CM | POA: Diagnosis not present

## 2023-10-15 DIAGNOSIS — Z79891 Long term (current) use of opiate analgesic: Secondary | ICD-10-CM | POA: Diagnosis not present

## 2023-10-15 DIAGNOSIS — R7303 Prediabetes: Secondary | ICD-10-CM | POA: Diagnosis not present

## 2023-10-15 DIAGNOSIS — R030 Elevated blood-pressure reading, without diagnosis of hypertension: Secondary | ICD-10-CM | POA: Diagnosis not present

## 2023-10-15 DIAGNOSIS — E034 Atrophy of thyroid (acquired): Secondary | ICD-10-CM | POA: Diagnosis not present

## 2023-10-15 DIAGNOSIS — E7849 Other hyperlipidemia: Secondary | ICD-10-CM | POA: Diagnosis not present

## 2023-10-17 DIAGNOSIS — G4736 Sleep related hypoventilation in conditions classified elsewhere: Secondary | ICD-10-CM | POA: Diagnosis not present

## 2023-10-21 DIAGNOSIS — G4734 Idiopathic sleep related nonobstructive alveolar hypoventilation: Secondary | ICD-10-CM | POA: Diagnosis not present

## 2023-10-21 DIAGNOSIS — J84112 Idiopathic pulmonary fibrosis: Secondary | ICD-10-CM | POA: Diagnosis not present

## 2023-10-28 DIAGNOSIS — N898 Other specified noninflammatory disorders of vagina: Secondary | ICD-10-CM | POA: Diagnosis not present

## 2023-10-28 DIAGNOSIS — I700 Atherosclerosis of aorta: Secondary | ICD-10-CM | POA: Diagnosis not present

## 2023-10-28 DIAGNOSIS — R300 Dysuria: Secondary | ICD-10-CM | POA: Diagnosis not present

## 2023-10-28 DIAGNOSIS — E7849 Other hyperlipidemia: Secondary | ICD-10-CM | POA: Diagnosis not present

## 2023-10-28 DIAGNOSIS — R7303 Prediabetes: Secondary | ICD-10-CM | POA: Diagnosis not present

## 2023-10-28 DIAGNOSIS — E559 Vitamin D deficiency, unspecified: Secondary | ICD-10-CM | POA: Diagnosis not present

## 2023-10-28 DIAGNOSIS — R030 Elevated blood-pressure reading, without diagnosis of hypertension: Secondary | ICD-10-CM | POA: Diagnosis not present

## 2023-10-28 DIAGNOSIS — K219 Gastro-esophageal reflux disease without esophagitis: Secondary | ICD-10-CM | POA: Diagnosis not present

## 2023-10-28 DIAGNOSIS — J8410 Pulmonary fibrosis, unspecified: Secondary | ICD-10-CM | POA: Diagnosis not present

## 2023-10-28 DIAGNOSIS — E034 Atrophy of thyroid (acquired): Secondary | ICD-10-CM | POA: Diagnosis not present

## 2023-10-28 DIAGNOSIS — E213 Hyperparathyroidism, unspecified: Secondary | ICD-10-CM | POA: Diagnosis not present

## 2023-11-07 ENCOUNTER — Telehealth: Payer: Self-pay

## 2023-11-07 NOTE — Telephone Encounter (Signed)
 ONO reviewed by Dr. Viva Grise- Low SpO2 91% on 2L O2.  O2 at 2L PM is ok. O2 remains >90% through the night.  I have notified the patient.   Nothing further needed.

## 2023-11-13 DIAGNOSIS — N898 Other specified noninflammatory disorders of vagina: Secondary | ICD-10-CM | POA: Diagnosis not present

## 2023-11-16 DIAGNOSIS — G4736 Sleep related hypoventilation in conditions classified elsewhere: Secondary | ICD-10-CM | POA: Diagnosis not present

## 2023-12-04 ENCOUNTER — Ambulatory Visit (INDEPENDENT_AMBULATORY_CARE_PROVIDER_SITE_OTHER): Admitting: Pulmonary Disease

## 2023-12-04 ENCOUNTER — Encounter: Payer: Self-pay | Admitting: Pulmonary Disease

## 2023-12-04 VITALS — BP 126/70 | HR 65 | Temp 97.6°F | Ht 64.0 in | Wt 124.0 lb

## 2023-12-04 DIAGNOSIS — J84112 Idiopathic pulmonary fibrosis: Secondary | ICD-10-CM | POA: Diagnosis not present

## 2023-12-04 DIAGNOSIS — G4734 Idiopathic sleep related nonobstructive alveolar hypoventilation: Secondary | ICD-10-CM | POA: Diagnosis not present

## 2023-12-04 DIAGNOSIS — R053 Chronic cough: Secondary | ICD-10-CM | POA: Diagnosis not present

## 2023-12-04 DIAGNOSIS — R0602 Shortness of breath: Secondary | ICD-10-CM

## 2023-12-04 DIAGNOSIS — R1319 Other dysphagia: Secondary | ICD-10-CM | POA: Diagnosis not present

## 2023-12-04 NOTE — Patient Instructions (Signed)
 VISIT SUMMARY:  Today, we discussed your chronic cough and shortness of breath related to idiopathic pulmonary fibrosis, as well as your sensation of choking. You mentioned that your symptoms have remained stable, and you feel better than during your last visit. We reviewed your current medications and planned further tests to better understand your condition.  YOUR PLAN:  -IDIOPATHIC PULMONARY FIBROSIS: Idiopathic pulmonary fibrosis is a lung disease that causes scarring of the lung tissue, leading to chronic cough and difficulty breathing. You should continue using Breztri  as prescribed and use your albuterol  inhaler during episodes of breathlessness. We will schedule a high-resolution chest CT, pulmonary function tests, and a six-minute walk test in August to assess your condition further.  -DYSPHAGIA: Dysphagia is difficulty swallowing, which can cause a sensation of choking. We will order an esophagogram to check for any esophageal strictures or other abnormalities that might be causing this sensation.  INSTRUCTIONS:  Please continue using Breztri  and your albuterol  inhaler as directed. We will schedule a high-resolution chest CT, pulmonary function tests, and a six-minute walk test in August. Additionally, we will arrange for an esophagogram to evaluate your swallowing difficulties. Please follow up after these tests to review the results and adjust your treatment plan if necessary.  Will see you in follow-up in August after all the testing is done.  We will call you in the interim with results of the swallow test.

## 2023-12-04 NOTE — Progress Notes (Signed)
 Subjective:    Patient ID: Cheyenne Wong, female    DOB: 10-05-1950, 73 y.o.   MRN: [id number]  Patient Care Team: Melchor Spoon, MD as PCP - General (Internal Medicine) Marc Senior, MD as Consulting Physician (Pulmonary Disease)  Chief Complaint  Patient presents with   Follow-up    Cough, shortness of breath on exertion and at rest. No wheezing.     BACKGROUND/INTERVAL: Patient is a 73 year old with UIP/idiopathic pulmonary fibrosis and nocturnal hypoxemia who presents for follow-up. This is a scheduled visit. Last visit here was on 02 October 2023. Most recent PFTs, chest CT and echocardiogram showed no progression of disease and echocardiogram did not show evidence of pulmonary hypertension. She is on nocturnal oxygen  for nocturnal hypoxemia and notes benefit from the therapy.  Notes fairly well control of her cough with Breztri .  She does have mild airways reactivity as well.  She has failed trials of Esbriet and Ofev .  HPI Discussed the use of AI scribe software for clinical note transcription with the patient, who gave verbal consent to proceed.  History of Present Illness   Cheyenne RAE "Aleda Hurl" is a 73 year old female with idiopathic pulmonary fibrosis who presents with chronic cough and dyspnea.  She experiences chronic cough and dyspnea, which have remained stable, though she feels better than during her last visit. She describes episodes of sudden breathlessness, feeling as if 'somebody will just grab me and cut my breath off,' leading to coughing. She uses Breztri  and an emergency inhaler, Albuterol , for these episodes.  She has previously tried Freight forwarder, which was not tolerated, and Ofev , which caused leg pain described as feeling like her legs were 'just going to crumble under me.'  She mentions a sensation of choking all the time, which she is unsure if it is related to her lungs or esophagus. A swallow test was done in 2021, and she is unsure if it is  related to her lungs or esophagus.  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   THERAPIES tried and failed: Esbriet (pirfenidone) - d/c'd due to "side effects" + cost Ofev  (nintendanib) - nausea,diarrhea, legs felt tight Has declined Pulmonary Rehab  Declined research protocols   DATA 10/24/2021 overnight oximetry: Qualified for O2 with significant desaturations overnight as low as 84%. 10/26/2021 CT chest high-resolution: Slightly worsened fibrotic changes and honeycombing from prior CT of 11 January 2020, findings consistent with UIP. 12/13/2021 PFTs: FEV1 1.50 L or 67% predicted, FVC 1.87 L or 63% predicted, FEV1/FVC 76%. Lung volumes moderately reduced, there is a small airways component.  Diffusion capacity moderately reduced.  Compared to PFTs performed at [facility] in April 2022 there has been slight decline in function. 07/20/2022 chest x-ray: Redemonstration of fibrosis/interstitial lung disease with no definitive acute superimposed disease. 10/26/2021 high-resolution CT chest: moderate pulmonary fibrosis in a pattern apical to basal gradient.  Fibrotic findings slightly worsened in comparison to prior examination of 11 January 2020.  Findings consistent with UIP. 02/19/2023 CT chest, high-resolution: Stable fibrotic changes in the lungs, categorized as diagnostic of usual interstitial pneumonia (UIP). 02/19/2023 PFTs: FEV1 1.35 L or 61% of predicted, FVC 1.78 L or 61% predicted, FEV1/FVC 76%, lung volumes moderately reduced.  Mild diffusion defect.  Overall no significant change from prior. 04/16/2023 echocardiogram: LVEF 55 to 60%, diastolic parameters are normal.  No wall motion abnormalities.  Mild calcification of mitral  valve leaflets moderate mitral annular ossification.  Trivial mitral valve regurgitation.  No mitral valve stenosis.  Review of Systems A 10 point review of systems was performed  and it is as noted above otherwise negative.   Patient Active Problem List   Diagnosis Date Noted   GERD (gastroesophageal reflux disease) 11/16/2021   Prediabetes 07/29/2019   Other dysphagia 04/01/2017   Pulmonary fibrosis (HCC) 10/15/2016   Need for hepatitis C screening test 06/02/2015   Screening for breast cancer 04/29/2015   Hypothyroidism 08/26/2012   Vitamin D  deficiency 10/02/2011   Hyperlipidemia 09/04/2011    Social History   Tobacco Use   Smoking status: Never   Smokeless tobacco: Never  Substance Use Topics   Alcohol use: No    Allergies  Allergen Reactions   Peanut Butter Flavoring Agent (Non-Screening) Shortness Of Breath    Peanut Butter   Tramadol Shortness Of Breath   Walnut Shortness Of Breath   Penicillins Rash    Allergy as a child    Current Meds  Medication Sig   albuterol  (VENTOLIN  HFA) 108 (90 Base) MCG/ACT inhaler Inhale 1 puff into the lungs every 6 (six) hours as needed.   atorvastatin (LIPITOR) 10 MG tablet Take 10 mg by mouth daily.   benzonatate  (TESSALON  PERLES) 100 MG capsule Take 1 capsule (100 mg total) by mouth 3 (three) times daily as needed for cough.   Budeson-Glycopyrrol-Formoterol (BREZTRI  AEROSPHERE) 160-9-4.8 MCG/ACT AERO Inhale 2 puffs into the lungs in the morning and at bedtime.   chlorpheniramine-HYDROcodone (TUSSIONEX) 10-8 MG/5ML Take by mouth. Take 5 mLs by mouth every 12 (twelve) hours as needed for Cough   cholecalciferol (VITAMIN D ) 25 MCG (1000 UNIT) tablet Take 2,000 Units by mouth daily.   cyanocobalamin (VITAMIN B12) 1000 MCG tablet Take 5,000 mcg by mouth once a week.   escitalopram (LEXAPRO) 5 MG tablet Take 5 mg by mouth as needed.   esomeprazole (NEXIUM) 40 MG capsule Take 40 mg by mouth daily at 12 noon.   famotidine (PEPCID) 20 MG tablet Take 20 mg by mouth 2 (two) times daily.   ibuprofen (ADVIL) 200 MG tablet Take 200 mg by mouth every 6 (six) hours as needed.   OXYGEN  Inhale 2 L into the lungs at  bedtime. And as needed    Immunization History  Administered Date(s) Administered   Influenza Inj Mdck Quad Pf 05/08/2018, 05/25/2019, 06/16/2020, 06/02/2021   Influenza Split 05/04/2011, 05/19/2012   Influenza,inj,Quad PF,6+ Mos 03/31/2013, 06/02/2015   Influenza,inj,quad, With Preservative 04/15/2016   Influenza-Unspecified 06/03/2017   Janssen (J&J) SARS-COV-2 Vaccination 10/23/2019   Moderna SARS-COV2 Booster Vaccination 06/24/2022   PFIZER Comirnaty(Gray Top)Covid-19 Tri-Sucrose Vaccine 05/23/2020   Pneumococcal Conjugate-13 06/20/2018   Pneumococcal Polysaccharide-23 07/16/2013, 07/29/2019   Tdap 07/16/2008, 07/28/2018   Zoster Recombinant(Shingrix) 12/13/2022, 04/08/2023        Objective:     BP 126/70 (BP Location: Left Arm, Patient Position: Sitting, Cuff Size: Normal)   Pulse 65   Temp 97.6 F (36.4 C) (Temporal)   Ht 5\' 4"  (1.626 m)   Wt 124 lb (56.2 kg)   SpO2 99%   BMI 21.28 kg/m   SpO2: 99 %  GENERAL: Well-developed, well-nourished woman, no acute distress.  Fully ambulatory, mild conversational dyspnea.  Mild tachypnea noted.  Occasionally will have dry cough. HEAD: Normocephalic, atraumatic.  EYES: Pupils equal, round, reactive to light.  No scleral icterus.  MOUTH: Oral mucosa moist.  No thrush, pharynx clear.  No angular cheilitis(previously noted). NECK: Supple.  No thyromegaly. Trachea midline. No JVD.  No adenopathy. PULMONARY: Good air entry bilaterally.  No wheezes, faint pops and faint bibasilar crackles. CARDIOVASCULAR: S1 and S2. Regular rate and rhythm.  No rubs, murmurs or gallops heard. ABDOMEN: Benign. MUSCULOSKELETAL: No joint deformity, no clubbing, no edema.  NEUROLOGIC: No overt focal deficit, no gait disturbance, speech is fluent. SKIN: Intact,warm,dry. PSYCH: Mood and behavior normal.     Assessment & Plan:     ICD-10-CM   1. IPF (idiopathic pulmonary fibrosis) (HCC)  J84.112 CT Chest High Resolution    Pulmonary function test     6 minute walk    2. SOB (shortness of breath)  R06.02 CT Chest High Resolution    Pulmonary function test    6 minute walk    3. Esophageal dysphagia  R13.19 DG ESOPHAGUS W SINGLE CM (SOL OR THIN BA)    4. Nocturnal hypoxemia  G47.34     5. Chronic cough  R05.3       Orders Placed This Encounter  Procedures   CT Chest High Resolution    Standing Status:   Future    Expected Date:   02/24/2024    Expiration Date:   12/03/2024    Preferred imaging location?:   [facility]   DG ESOPHAGUS W SINGLE CM (SOL OR THIN BA)    Standing Status:   Future    Expected Date:   12/18/2023    Expiration Date:   12/03/2024    Reason for Exam (SYMPTOM  OR DIAGNOSIS REQUIRED):   Dysphagia    Preferred imaging location?:   [facility]   Pulmonary function test    Standing Status:   Future    Expected Date:   02/24/2024    Expiration Date:   12/03/2024    Where should this test be performed?:   Outpatient [facility]    What type of PFT is being ordered?:   Full PFT   6 minute walk    Standing Status:   Future    Expected Date:   02/24/2024    Expiration Date:   12/03/2024    Where should this test be performed?:   [facility]   Discussion:    Idiopathic Pulmonary Fibrosis Chronic idiopathic pulmonary fibrosis with persistent cough and dyspnea. Previous trials of Esbriet and Ofev  were not tolerated due to adverse effects, including leg pain. Currently on Breztri  which helps with cough. Episodes of breathlessness and cough persist, possibly due to airway constriction. - Continue Breztri  as prescribed - Use albuterol  inhaler during episodes of breathlessness - Schedule high-resolution chest CT, pulmonary function tests, and six-minute walk test in August - Follow up visit after tests to review results and adjust treatment plan if necessary  Dysphagia Complaints of choking sensation, possibly related to esophageal issues. Previous swallow test in 2021 was unremarkable. Differential includes  esophageal stricture or other structural abnormalities. - Order esophagogram to evaluate for esophageal strictures or other abnormalities      Advised if symptoms do not improve or worsen, to please contact office for sooner follow up or seek emergency care.    I spent 40 minutes of dedicated to the care of this patient on the date of this encounter to include pre-visit review of records, face-to-face time with the patient discussing conditions above, post visit ordering of testing, clinical documentation with the electronic health record, making appropriate referrals as documented, and communicating necessary findings to members of the patients care team.     Vernia Good  Viva Grise, MD Advanced Bronchoscopy PCCM [facility]    *This note was generated using voice recognition software/Dragon and/or AI transcription program.  Despite best efforts to proofread, errors can occur which can change the meaning. Any transcriptional errors that result from this process are unintentional and may not be fully corrected at the time of dictation.

## 2023-12-06 ENCOUNTER — Telehealth: Payer: Self-pay

## 2023-12-06 ENCOUNTER — Ambulatory Visit
Admission: RE | Admit: 2023-12-06 | Discharge: 2023-12-06 | Disposition: A | Discharge status: Home / Self Care | Source: Ambulatory Visit | Attending: Pulmonary Disease | Admitting: Pulmonary Disease

## 2023-12-06 DIAGNOSIS — J84112 Idiopathic pulmonary fibrosis: Secondary | ICD-10-CM

## 2023-12-06 DIAGNOSIS — R1319 Other dysphagia: Secondary | ICD-10-CM | POA: Insufficient documentation

## 2023-12-06 DIAGNOSIS — R1312 Dysphagia, oropharyngeal phase: Secondary | ICD-10-CM

## 2023-12-06 DIAGNOSIS — T17908A Unspecified foreign body in respiratory tract, part unspecified causing other injury, initial encounter: Secondary | ICD-10-CM

## 2023-12-06 NOTE — Telephone Encounter (Signed)
 Copied from CRM [id number]. Topic: Clinical - Lab/Test Results >> Dec 06, 2023  2:26 PM Eveleen Hinds B wrote: Reason for CRM:   [facility] radiology regarding Xray. Please call [phone]

## 2023-12-06 NOTE — Telephone Encounter (Signed)
 I notified patient of the results from the esophagogram performed today.  The patient needs a formal swallow evaluation by speech pathology (modified barium swallow) as it appears that she is having some issues with aspiration.  Patient is aware and understands need for referral to speech pathology.

## 2023-12-06 NOTE — Telephone Encounter (Signed)
 I tried to return Stacy's call to confirm I received the results. I did not get an answer.  Forwarding this message to Dr. Viva Grise.

## 2023-12-07 ENCOUNTER — Ambulatory Visit: Payer: Self-pay | Admitting: Pulmonary Disease

## 2023-12-10 NOTE — Telephone Encounter (Signed)
 Noted. Nothing further needed.

## 2023-12-11 DIAGNOSIS — M542 Cervicalgia: Secondary | ICD-10-CM | POA: Diagnosis not present

## 2023-12-16 ENCOUNTER — Ambulatory Visit

## 2023-12-16 DIAGNOSIS — L570 Actinic keratosis: Secondary | ICD-10-CM | POA: Diagnosis not present

## 2023-12-16 DIAGNOSIS — D492 Neoplasm of unspecified behavior of bone, soft tissue, and skin: Secondary | ICD-10-CM | POA: Diagnosis not present

## 2023-12-17 ENCOUNTER — Other Ambulatory Visit: Payer: Self-pay | Admitting: Family Medicine

## 2023-12-17 DIAGNOSIS — M542 Cervicalgia: Secondary | ICD-10-CM | POA: Diagnosis not present

## 2023-12-17 DIAGNOSIS — M5412 Radiculopathy, cervical region: Secondary | ICD-10-CM

## 2023-12-17 DIAGNOSIS — G4736 Sleep related hypoventilation in conditions classified elsewhere: Secondary | ICD-10-CM | POA: Diagnosis not present

## 2023-12-17 DIAGNOSIS — M7918 Myalgia, other site: Secondary | ICD-10-CM | POA: Diagnosis not present

## 2023-12-22 ENCOUNTER — Other Ambulatory Visit

## 2023-12-26 IMAGING — CT CT CHEST HIGH RESOLUTION
2 of 7 series · 13 of 36 positions shown, 16 images · non-contrast
Comparison: CT chest, 01/11/2020, 12/16/2018, 07/02/2017,
09/26/2016, CT abdomen pelvis, 03/10/2014

CLINICAL DATA: Pulmonary fibrosis, persistent dry cough worsening
shortness of breath



[Series 4: thorax 2.00 cor · coronal · 0.55mm/px · 3 of 121 slices shown]
[im 25/121  lung]
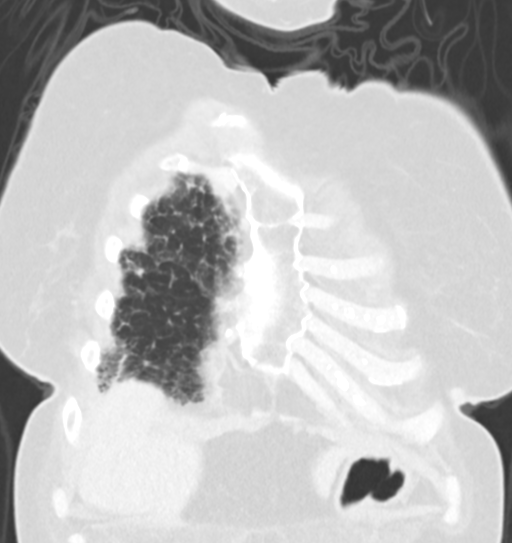
[im 49/121  lung]
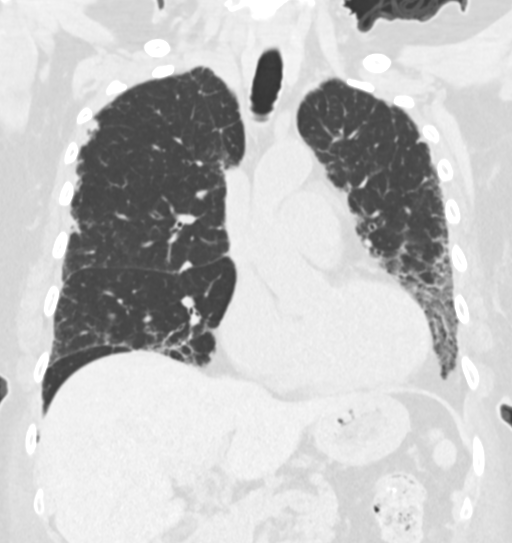
[im 73/121  lung]
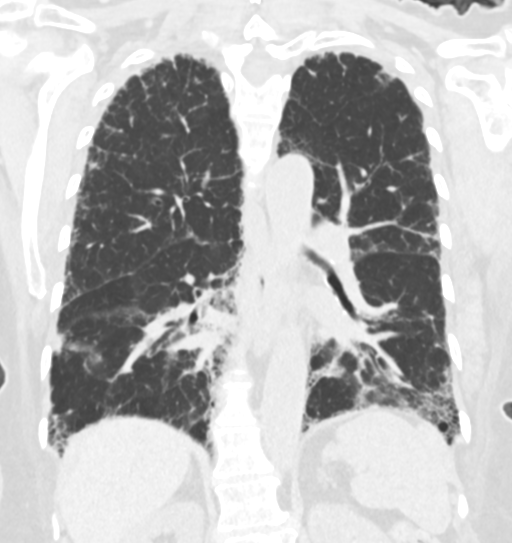

[Series 11: high res (id) thorax 1.00 ax · axial · 0.47mm/px · z∈[-1150,-899]mm · 10 of 297 slices shown, 13 images]
[im 23/297  mediastinal]
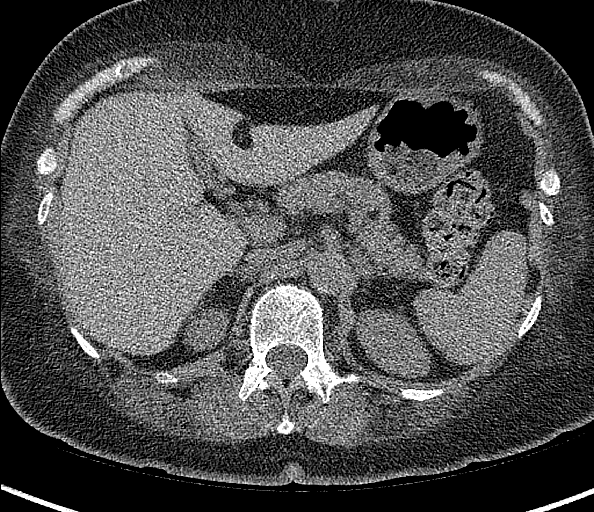
[im 23/297  lung]
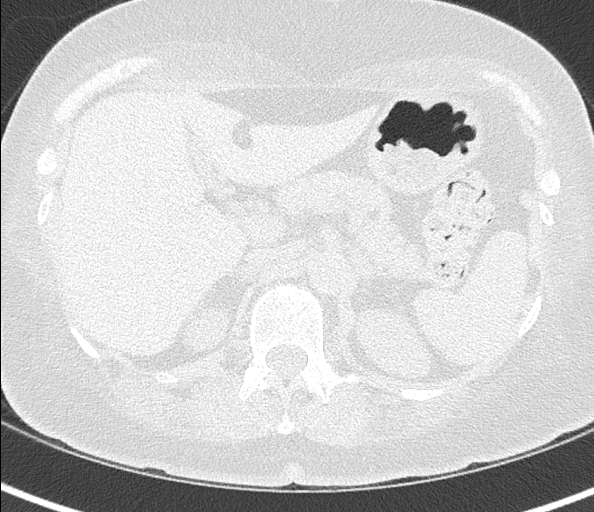
[im 46/297  lung]
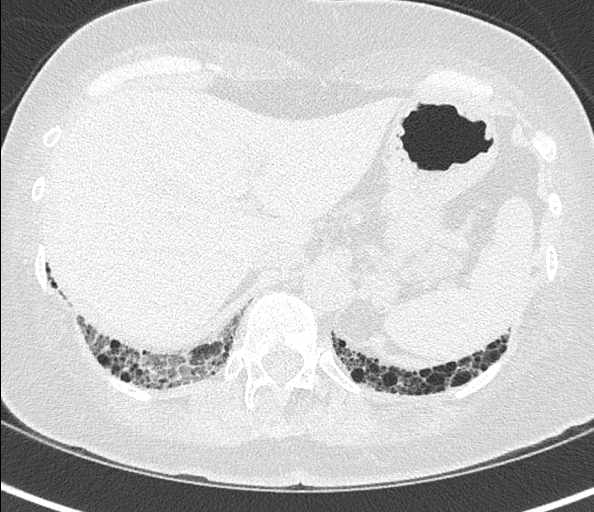
[im 92/297  lung]
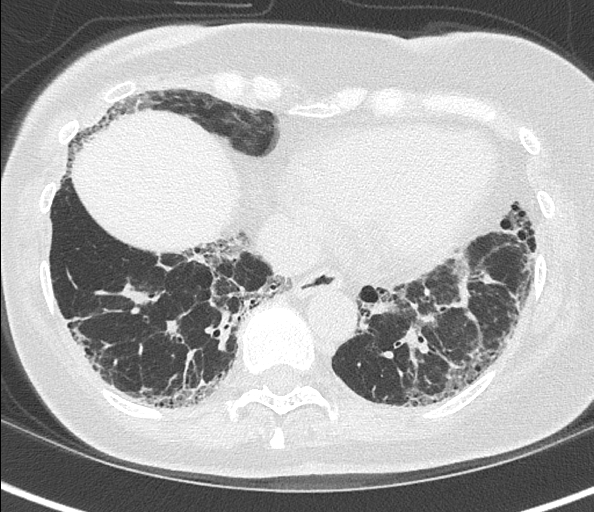
[im 114/297  lung]
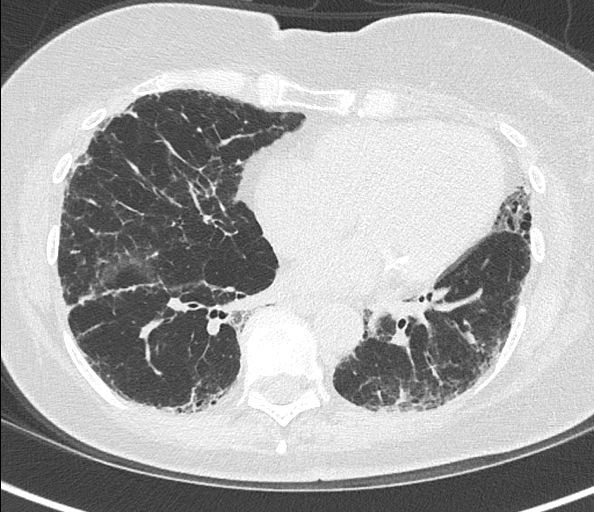
[im 137/297  mediastinal]
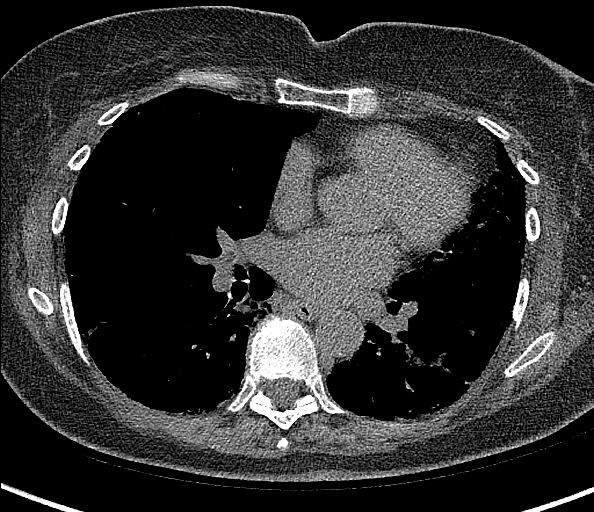
[im 137/297  lung]
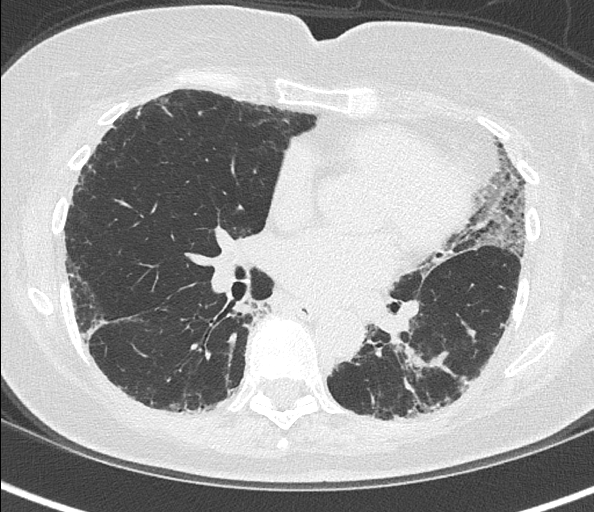
[im 160/297  lung]
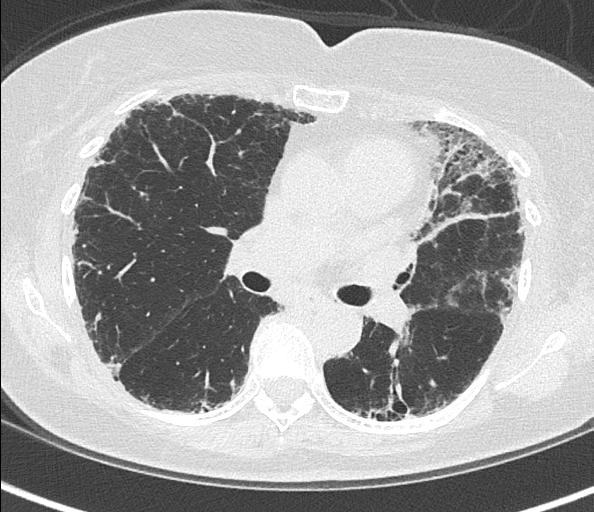
[im 183/297  lung]
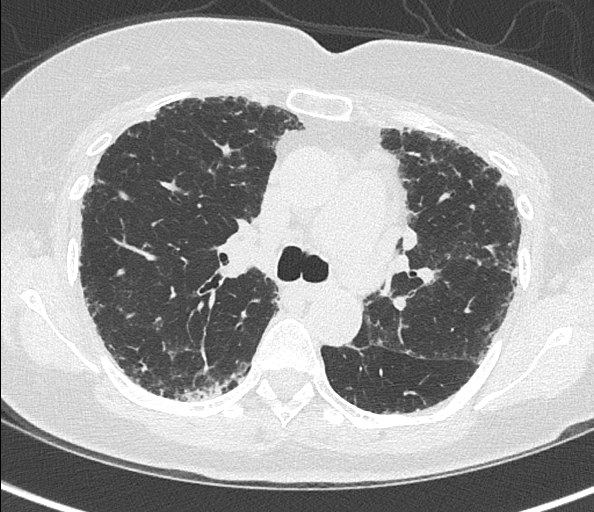
[im 228/297  lung]
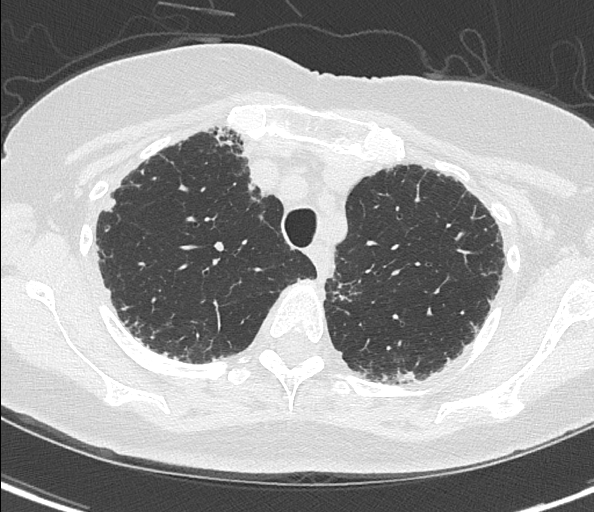
[im 251/297  mediastinal]
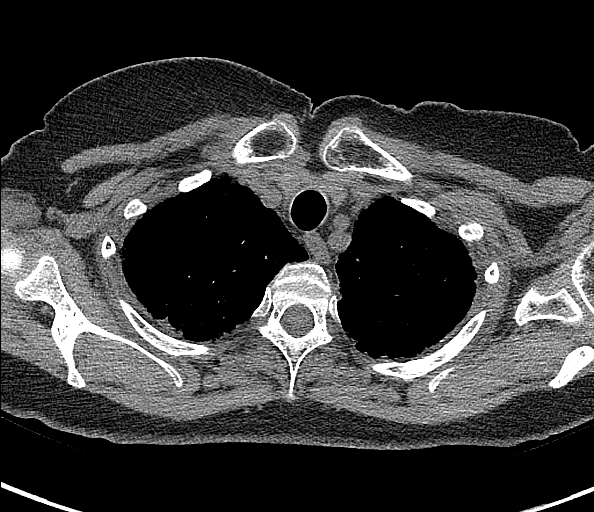
[im 251/297  lung]
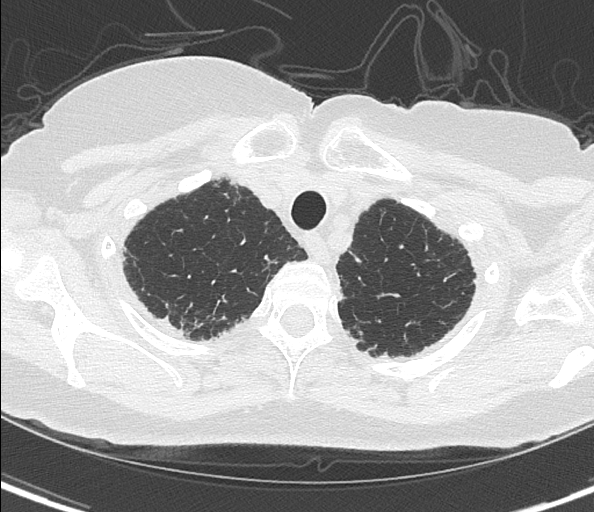
[im 274/297  lung]
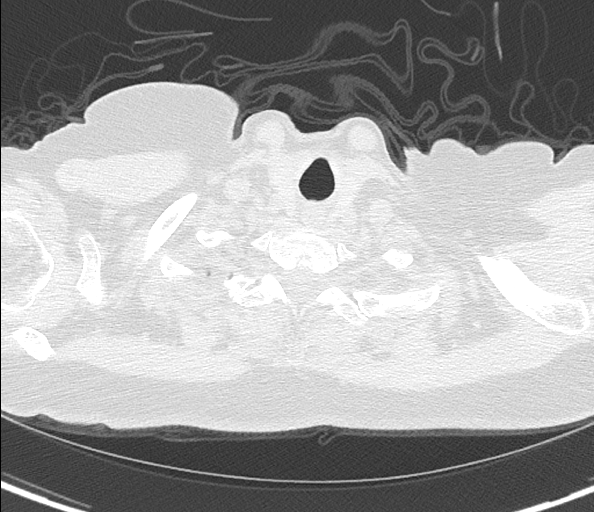

[13 of 36 positions shown; findings below may reference images not displayed]

FINDINGS: Cardiovascular: Scattered aortic atherosclerosis. Normal heart size.
No pericardial effusion.

Mediastinum/Nodes: No enlarged mediastinal, hilar, or axillary lymph
nodes. Thyroid gland, trachea, and esophagus demonstrate no
significant findings.

Lungs/Pleura: Moderate pulmonary fibrosis in a pattern with apical
to basal gradient, featuring irregular peripheral interstitial
opacity, septal thickening, areas of subpleural bronchiolectasis,
and more clear evidence of honeycombing. Fibrotic findings are
slightly worsened in comparison to prior examination dated
01/11/2020 as well as over a longer period of time dating back to
03/10/2014. No significant air trapping on expiratory phase imaging.
No pleural effusion or pneumothorax.

Upper Abdomen: No acute abnormality. Rim calcified gallstone in the
gallbladder fundus.

Musculoskeletal: No chest wall abnormality. No suspicious osseous
lesions identified.
IMPRESSION: 1. Moderate pulmonary fibrosis in a pattern with apical to basal
gradient, featuring irregular peripheral interstitial opacity,
septal thickening, areas of subpleural bronchiolectasis, and more
clear evidence of honeycombing. Fibrotic findings are slightly
worsened in comparison to prior examination dated 01/11/2020 as well
as over a longer period of time dating back to 03/10/2014. Findings
are consistent with UIP per consensus guidelines: Diagnosis of
Idiopathic Pulmonary Fibrosis: An Official ATS/ERS/JRS/ALAT Clinical
Practice Guideline. Am J Respir Crit Care Med Vol 198, [place]un[place] 5,
ppe22-e[DATE].
2. Aortic atherosclerosis.
3. Cholelithiasis.

Aortic Atherosclerosis (KQOOR-ZGY.Y).

## 2023-12-30 ENCOUNTER — Ambulatory Visit
Admission: RE | Admit: 2023-12-30 | Discharge: 2023-12-30 | Disposition: A | Discharge status: Home / Self Care | Source: Ambulatory Visit | Attending: Pulmonary Disease | Admitting: Pulmonary Disease

## 2023-12-30 DIAGNOSIS — J84112 Idiopathic pulmonary fibrosis: Secondary | ICD-10-CM | POA: Diagnosis not present

## 2023-12-30 DIAGNOSIS — T17908A Unspecified foreign body in respiratory tract, part unspecified causing other injury, initial encounter: Secondary | ICD-10-CM | POA: Diagnosis not present

## 2023-12-30 DIAGNOSIS — R1312 Dysphagia, oropharyngeal phase: Secondary | ICD-10-CM | POA: Insufficient documentation

## 2023-12-30 DIAGNOSIS — R638 Other symptoms and signs concerning food and fluid intake: Secondary | ICD-10-CM | POA: Diagnosis not present

## 2023-12-30 DIAGNOSIS — R053 Chronic cough: Secondary | ICD-10-CM | POA: Diagnosis not present

## 2023-12-30 NOTE — Therapy (Addendum)
 Modified Barium Swallow Study  Patient Details  Name: Cheyenne Wong MRN: [id number] Date of Birth: 05-22-1951  Today's Date: 12/30/2023  Modified Barium Swallow completed.  Full report located under Chart Review in the Imaging Section.  History of Present Illness Pt is a 73 y.o. female who presents for MBSS. Pt with a history of GERD, well controlled on medication, and pulmonary fibrosis with chronic   cough. States that she has coughing episodes that last up to an hour after eating or drinking. Barium Swallow, 12/06/23, Initial swallows unremarkable; however laryngeal   penetration with tracheal aspiration noted to swallows in RAO position with thin barium.   Clinical Impression Pt demonstrated functional oropharyngeal swallowing. Pt with mildly prolonged, but functional, mastication of solids which is likely due to dental status. Pt with transient laryngeal penetration which occured prior to the swallow and cleared with swallow completion with thin liquids which is [facility] for age and is common in pt's with respiratory disease. Pt with baseline cough which did not appear to correlate clinically with oropharyngeal swallow. Recommend continuation of current diet with standard aspiration precautions and reflux precautoins. Introduced basic cough suppression techniques, pt may benefit from further treatment of cough suppression. Factors that may increase risk of adverse event in presence of aspiration Roderick Civatte & Jessy Morocco 2021): Respiratory or GI disease  Swallow Evaluation Recommendations Recommendations: PO diet PO Diet Recommendation: Regular;Thin liquids (Level 0) (masticate solids thoroughly) Liquid Administration via: Spoon;Cup;Straw Medication Administration:  (as tolerated) Supervision: Patient able to self-feed Swallowing strategies  : Slow rate;Small bites/sips;Follow solids with liquids Postural changes: Position pt fully upright for meals;Stay upright 30-60 min after meals;Out of bed  for meals Oral care recommendations: Oral care BID (2x/day);Pt independent with oral care Recommended consults:  (Pulmonology following)    Cheyenne Wong, M.S., Cheyenne Wong Speech-Language Pathologist [facility] [phone] (ASCOM)   Leory Rands Monic Engelmann 12/30/2023,1:10 PM

## 2024-01-16 DIAGNOSIS — G4736 Sleep related hypoventilation in conditions classified elsewhere: Secondary | ICD-10-CM | POA: Diagnosis not present

## 2024-01-31 ENCOUNTER — Inpatient Hospital Stay: Admission: RE | Admit: 2024-01-31 | Source: Ambulatory Visit

## 2024-02-16 DIAGNOSIS — G4736 Sleep related hypoventilation in conditions classified elsewhere: Secondary | ICD-10-CM | POA: Diagnosis not present

## 2024-02-20 ENCOUNTER — Ambulatory Visit
Admission: RE | Admit: 2024-02-20 | Discharge: 2024-02-20 | Disposition: A | Discharge status: Home / Self Care | Source: Ambulatory Visit | Attending: Pulmonary Disease | Admitting: Pulmonary Disease

## 2024-02-20 DIAGNOSIS — J849 Interstitial pulmonary disease, unspecified: Secondary | ICD-10-CM | POA: Diagnosis not present

## 2024-02-20 DIAGNOSIS — R0602 Shortness of breath: Secondary | ICD-10-CM | POA: Insufficient documentation

## 2024-02-20 DIAGNOSIS — J84112 Idiopathic pulmonary fibrosis: Secondary | ICD-10-CM | POA: Insufficient documentation

## 2024-02-20 DIAGNOSIS — I700 Atherosclerosis of aorta: Secondary | ICD-10-CM | POA: Diagnosis not present

## 2024-02-20 DIAGNOSIS — K449 Diaphragmatic hernia without obstruction or gangrene: Secondary | ICD-10-CM | POA: Diagnosis not present

## 2024-03-02 ENCOUNTER — Ambulatory Visit

## 2024-03-02 ENCOUNTER — Ambulatory Visit (INDEPENDENT_AMBULATORY_CARE_PROVIDER_SITE_OTHER)

## 2024-03-02 DIAGNOSIS — J84112 Idiopathic pulmonary fibrosis: Secondary | ICD-10-CM | POA: Diagnosis not present

## 2024-03-02 DIAGNOSIS — R0602 Shortness of breath: Secondary | ICD-10-CM | POA: Diagnosis not present

## 2024-03-05 ENCOUNTER — Ambulatory Visit (INDEPENDENT_AMBULATORY_CARE_PROVIDER_SITE_OTHER): Admitting: Pulmonary Disease

## 2024-03-05 ENCOUNTER — Ambulatory Visit: Payer: Self-pay | Admitting: Pulmonary Disease

## 2024-03-05 ENCOUNTER — Other Ambulatory Visit
Admission: RE | Admit: 2024-03-05 | Discharge: 2024-03-05 | Disposition: A | Discharge status: Home / Self Care | Attending: Pulmonary Disease | Admitting: Pulmonary Disease

## 2024-03-05 ENCOUNTER — Encounter: Payer: Self-pay | Admitting: Pulmonary Disease

## 2024-03-05 ENCOUNTER — Ambulatory Visit

## 2024-03-05 VITALS — BP 136/92 | HR 76 | Temp 97.8°F | Ht 64.0 in | Wt 120.0 lb

## 2024-03-05 DIAGNOSIS — G4734 Idiopathic sleep related nonobstructive alveolar hypoventilation: Secondary | ICD-10-CM | POA: Diagnosis not present

## 2024-03-05 DIAGNOSIS — J84112 Idiopathic pulmonary fibrosis: Secondary | ICD-10-CM

## 2024-03-05 DIAGNOSIS — R5383 Other fatigue: Secondary | ICD-10-CM | POA: Diagnosis not present

## 2024-03-05 DIAGNOSIS — F4389 Other reactions to severe stress: Secondary | ICD-10-CM

## 2024-03-05 DIAGNOSIS — R0602 Shortness of breath: Secondary | ICD-10-CM

## 2024-03-05 DIAGNOSIS — D721 Eosinophilia, unspecified: Secondary | ICD-10-CM

## 2024-03-05 LAB — CBC WITH DIFFERENTIAL/PLATELET
Abs Immature Granulocytes: 0.02 K/uL (ref 0.00–0.07)
Basophils Absolute: 0.1 K/uL (ref 0.0–0.1)
Basophils Relative: 1 %
Eosinophils Absolute: 0.5 K/uL (ref 0.0–0.5)
Eosinophils Relative: 5 %
HCT: 42.4 % (ref 36.0–46.0)
Hemoglobin: 13.7 g/dL (ref 12.0–15.0)
Immature Granulocytes: 0 %
Lymphocytes Relative: 22 %
Lymphs Abs: 2.1 K/uL (ref 0.7–4.0)
MCH: 28.8 pg (ref 26.0–34.0)
MCHC: 32.3 g/dL (ref 30.0–36.0)
MCV: 89.1 fL (ref 80.0–100.0)
Monocytes Absolute: 0.6 K/uL (ref 0.1–1.0)
Monocytes Relative: 7 %
Neutro Abs: 6.3 K/uL (ref 1.7–7.7)
Neutrophils Relative %: 65 %
Platelets: 232 K/uL (ref 150–400)
RBC: 4.76 MIL/uL (ref 3.87–5.11)
RDW: 12.8 % (ref 11.5–15.5)
WBC: 9.6 K/uL (ref 4.0–10.5)
nRBC: 0 % (ref 0.0–0.2)

## 2024-03-05 LAB — PULMONARY FUNCTION TEST
DL/VA % pred: 110 %
DL/VA: 4.62 ml/min/mmHg/L
DLCO unc % pred: 63 %
DLCO unc: 12.17 ml/min/mmHg
FEF 25-75 Post: 0.58 L/s
FEF 25-75 Pre: 0.8 L/s
FEF2575-%Change-Post: -27 %
FEF2575-%Pred-Post: 30 %
FEF2575-%Pred-Pre: 42 %
FEV1-%Change-Post: -1 %
FEV1-%Pred-Post: 55 %
FEV1-%Pred-Pre: 55 %
FEV1-Post: 1.25 L
FEV1-Pre: 1.26 L
FEV1FVC-%Change-Post: 12 %
FEV1FVC-%Pred-Pre: 89 %
FEV6-%Change-Post: -15 %
FEV6-%Pred-Post: 54 %
FEV6-%Pred-Pre: 64 %
FEV6-Post: 1.56 L
FEV6-Pre: 1.86 L
FEV6FVC-%Pred-Post: 104 %
FEV6FVC-%Pred-Pre: 104 %
FVC-%Change-Post: -12 %
FVC-%Pred-Post: 54 %
FVC-%Pred-Pre: 61 %
FVC-Post: 1.63 L
FVC-Pre: 1.86 L
Post FEV1/FVC ratio: 77 %
Post FEV6/FVC ratio: 100 %
Pre FEV1/FVC ratio: 68 %
Pre FEV6/FVC Ratio: 100 %
RV % pred: 70 %
RV: 1.55 L
TLC % pred: 63 %
TLC: 3.22 L

## 2024-03-05 LAB — TSH: TSH: 2.479 u[IU]/mL (ref 0.350–4.500)

## 2024-03-05 LAB — T4, FREE: Free T4: 0.88 ng/dL (ref 0.61–1.12)

## 2024-03-05 LAB — CORTISOL: Cortisol, Plasma: 5.6 ug/dL

## 2024-03-05 NOTE — Progress Notes (Signed)
 Full PFT completed today. Pleth done Pre and Post.

## 2024-03-05 NOTE — Patient Instructions (Signed)
 Full PFT completed today ? ?

## 2024-03-05 NOTE — Patient Instructions (Signed)
 VISIT SUMMARY:  Today, we discussed your idiopathic pulmonary fibrosis (IPF) and associated chronic cough, as well as your fatigue, sleep disturbances, and allergic symptoms. We reviewed your current oxygen  use and travel plans, and we considered potential causes for your fatigue and sleep issues. We also addressed your allergic reactions and planned further testing.  YOUR PLAN:  -IDIOPATHIC PULMONARY FIBROSIS WITH CHRONIC COUGH: Idiopathic pulmonary fibrosis (IPF) is a lung disease that causes scarring of the lungs, leading to breathing difficulties and a persistent cough. Your lung function is stable with a slight decline, and your oxygen  levels remain stable during movement. Continue using your oxygen  concentrator as needed.  -FATIGUE AND SLEEP DISTURBANCE: Your fatigue and sleep issues are likely due to fragmented sleep and caretaker fatigue. We will check your thyroid  function to rule out any thyroid  problems. Discuss your caretaker fatigue with your primary care provider, and contact your oxygen  provider to arrange for portable oxygen  for your upcoming travel.  -ALLERGIC SYMPTOMS WITH PERIPHERAL EOSINOPHILIA: You have allergic reactions to various smells and foods, possibly including laundry detergent. We will recheck your eosinophil levels with a blood test to further investigate these symptoms.  INSTRUCTIONS:  Please follow up with your primary care provider to discuss caretaker fatigue and get recommendations for vitamins. Contact your oxygen  provider to arrange for portable oxygen  for your travel. We will also check your thyroid  function and recheck your eosinophil levels with a blood test.

## 2024-03-05 NOTE — Progress Notes (Signed)
 Subjective:    Patient ID: Cheyenne Wong, female    DOB: 22-Mar-1951, 73 y.o.   MRN: [id number]  Patient Care Team: Fernande Ophelia JINNY DOUGLAS, MD as PCP - General (Internal Medicine) Tamea Dedra CROME, MD as Consulting Physician (Pulmonary Disease)  Chief Complaint  Patient presents with   Shortness of Breath    Moderate shortness of breath. Using Ventolin  3 times a week, reports mild improvement.     BACKGROUND/INTERVAL:Patient is a 73 year old with UIP/idiopathic pulmonary fibrosis and nocturnal hypoxemia who presents for follow-up. This is a scheduled visit. Last visit here was on 04 Dec 2023. Most recent PFTs, chest CT and echocardiogram showed no progression of disease and echocardiogram did not show evidence of pulmonary hypertension. She is on nocturnal oxygen  for nocturnal hypoxemia and notes benefit from the therapy.  Notes fairly well control of her cough with Breztri .  She does have mild airways reactivity as well.  She has failed trials of Esbriet and Ofev .   HPI Discussed the use of AI scribe software for clinical note transcription with the patient, who gave verbal consent to proceed.  History of Present Illness   Cheyenne Wong is a 73 year old female with idiopathic pulmonary fibrosis who presents for follow-up on IPF and associated cough.  She uses a concentrator for supplemental oxygen  all night and has tanks available but has not used them yet. She recently received a new concentrator in January after changing Medicare providers. She is considering a trip to [place] with her son in January but is concerned about traveling with her oxygen  needs.  She experiences significant fatigue, stating 'I have no energy whatsoever.' After eating, she often needs to sit down and falls asleep quickly. She attributes some of her fatigue to her age but is unsure of the exact cause. She attempted to get vitamin recommendations from her primary care provider but has not yet been  able to do so. 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 She acknowledges experiencing 'caretaker fatigue' and notes that her attitude has changed due to the stress.  Her sleep quality is poor, describing it as 'not worth a flip.' She sleeps for about fifteen minutes at a time before waking up and finds it difficult to fall asleep initially. She mentions that a pain pill taken for neck pain helped her sleep better, but she is not seeking pain medication for sleep.  She reports being allergic to many smells and foods, with worsening symptoms. She suspects laundry detergent as a possible allergen.   Discussed results from PFTs today which shows stable lung function.    THERAPIES tried and failed: Esbriet (pirfenidone) - d/c'd due to side effects + cost Ofev  (nintendanib) - nausea,diarrhea, legs felt tight Has declined Pulmonary Rehab  Declined research protocols   DATA 10/24/2021 overnight oximetry: Qualified for O2 with significant desaturations overnight as low as 84%. 10/26/2021 CT chest high-resolution: Slightly worsened fibrotic changes and honeycombing from prior CT of 11 January 2020, findings consistent with UIP. 12/13/2021 PFTs: FEV1 1.50 L or 67% predicted, FVC 1.87 L or 63% predicted, FEV1/FVC 76%. Lung volumes moderately reduced, there is a small airways component.  Diffusion capacity moderately reduced.  Compared to PFTs performed at [facility] clinic in April 2022 there has been slight decline in function. 07/20/2022 chest x-ray: Redemonstration of fibrosis/interstitial lung disease with no definitive acute superimposed disease.  10/26/2021 high-resolution CT chest: moderate pulmonary fibrosis in a pattern apical to basal gradient.  Fibrotic findings slightly worsened in comparison to prior examination of 11 January 2020.  Findings consistent with UIP. 02/19/2023 CT chest, high-resolution:  Stable fibrotic changes in the lungs, categorized as diagnostic of usual interstitial pneumonia (UIP). 02/19/2023 PFTs: FEV1 1.35 L or 61% of predicted, FVC 1.78 L or 61% predicted, FEV1/FVC 76%, lung volumes moderately reduced.  Mild diffusion defect.  Overall no significant change from prior. 04/16/2023 echocardiogram: LVEF 55 to 60%, diastolic parameters are normal.  No wall motion abnormalities.  Mild calcification of mitral valve leaflets moderate mitral annular ossification.  Trivial mitral valve regurgitation.  No mitral valve stenosis. 03/05/2024 PFTs: FEV1 is 1.26 L or 55% predicted, FVC is 1.86 L or 61% predicted, FEV1/FVC 68% lung volumes moderately reduced.  Diffusion capacity moderately reduced and corrects to normal by alveolar volume.  Overall lung function is relatively stable.  Review of Systems A 10 point review of systems was performed and it is as noted above otherwise negative.   Patient Active Problem List   Diagnosis Date Noted   GERD (gastroesophageal reflux disease) 11/16/2021   Prediabetes 07/29/2019   Other dysphagia 04/01/2017   Pulmonary fibrosis (HCC) 10/15/2016   Need for hepatitis C screening test 06/02/2015   Screening for breast cancer 04/29/2015   Hypothyroidism 08/26/2012   Vitamin D  deficiency 10/02/2011   Hyperlipidemia 09/04/2011    Social History   Tobacco Use   Smoking status: Never   Smokeless tobacco: Never  Substance Use Topics   Alcohol use: No    Allergies  Allergen Reactions   Peanut Butter Flavoring Agent (Non-Screening) Shortness Of Breath    Peanut Butter   Tramadol Shortness Of Breath   Walnut Shortness Of Breath   Penicillins Rash    Allergy as a child    Current Meds  Medication Sig   albuterol  (VENTOLIN  HFA) 108 (90 Base) MCG/ACT inhaler Inhale 1 puff into the lungs every 6 (six) hours as needed.   atorvastatin (LIPITOR) 10 MG tablet Take 10 mg by mouth daily.   benzonatate  (TESSALON  PERLES) 100 MG capsule Take 1  capsule (100 mg total) by mouth 3 (three) times daily as needed for cough.   Budeson-Glycopyrrol-Formoterol (BREZTRI  AEROSPHERE) 160-9-4.8 MCG/ACT AERO Inhale 2 puffs into the lungs in the morning and at bedtime.   cholecalciferol (VITAMIN D ) 25 MCG (1000 UNIT) tablet Take 2,000 Units by mouth daily.   cyanocobalamin (VITAMIN B12) 1000 MCG tablet Take 5,000 mcg by mouth once a week.   esomeprazole (NEXIUM) 40 MG capsule Take 40 mg by mouth daily at 12 noon.   famotidine (PEPCID) 20 MG tablet Take 20 mg by mouth 2 (two) times daily.   ibuprofen (ADVIL) 200 MG tablet Take 200 mg by mouth every 6 (six) hours as needed.   OXYGEN  Inhale 2 L into the lungs at bedtime. And as needed    Immunization History  Administered Date(s) Administered   Influenza Inj Mdck Quad Pf 05/08/2018, 05/25/2019, 06/16/2020, 06/02/2021   Influenza Split 05/04/2011, 05/19/2012   Influenza,inj,Quad PF,6+ Mos 03/31/2013, 06/02/2015   Influenza,inj,quad, With Preservative 04/15/2016   Influenza-Unspecified 06/03/2017   Janssen (J&J) SARS-COV-2 Vaccination 10/23/2019   Moderna SARS-COV2 Booster Vaccination 06/24/2022   PFIZER Comirnaty(Gray Top)Covid-19 Tri-Sucrose Vaccine 05/23/2020   Pneumococcal Conjugate-13 06/20/2018   Pneumococcal Polysaccharide-23 07/16/2013, 07/29/2019   Tdap 07/16/2008, 07/28/2018   Zoster Recombinant(Shingrix) 12/13/2022, 04/08/2023        Objective:  BP (!) 136/92   Pulse 76   Temp 97.8 F (36.6 C) (Oral)   Ht 5' 4 (1.626 m)   Wt 120 lb (54.4 kg)   SpO2 97%   BMI 20.60 kg/m   SpO2: 97 %  GENERAL: Well-developed, well-nourished woman, no acute distress.  Fully ambulatory, mild conversational dyspnea.  Mild tachypnea noted.  Occasionally will have dry cough. HEAD: Normocephalic, atraumatic.  EYES: Pupils equal, round, reactive to light.  No scleral icterus.  MOUTH: Oral mucosa moist.  No thrush, pharynx clear.  No angular cheilitis(previously noted). NECK: Supple. No  thyromegaly. Trachea midline. No JVD.  No adenopathy. PULMONARY: Good air entry bilaterally.  No wheezes, faint pops and faint bibasilar crackles. CARDIOVASCULAR: S1 and S2. Regular rate and rhythm.  No rubs, murmurs or gallops heard. ABDOMEN: Benign. MUSCULOSKELETAL: No joint deformity, no clubbing, no edema.  NEUROLOGIC: No overt focal deficit, no gait disturbance, speech is fluent. SKIN: Intact,warm,dry. PSYCH: Mood and behavior normal.        Assessment & Plan:     ICD-10-CM   1. IPF (idiopathic pulmonary fibrosis) (HCC)  J84.112     2. 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 Fatigue, unspecified type  R53.83 TSH    T4, free    Cortisol    CBC with Differential/Platelet    4. Eosinophilia, unspecified type  D72.10 CBC with Differential/Platelet    5. Nocturnal hypoxemia  G47.34       Orders Placed This Encounter  Procedures   TSH    Standing Status:   Future    Number of Occurrences:   1    Expiration Date:   03/05/2025   T4, free    Standing Status:   Future    Number of Occurrences:   1    Expiration Date:   03/05/2025   Cortisol    Standing Status:   Future    Number of Occurrences:   1    Expiration Date:   03/05/2025   CBC with Differential/Platelet    Standing Status:   Future    Number of Occurrences:   1    Expected Date:   03/05/2024    Expiration Date:   03/05/2025   Discussion:    Idiopathic pulmonary fibrosis with chronic cough Lung function is well-managed with a slight decline of less than one percent. Oxygen  levels remain stable during ambulation. Chronic cough persists as a symptom of IPF.  Fatigue and sleep disturbance Persistent fatigue and significant sleep disturbance with fragmented sleep, frequent awakenings, and difficulty falling asleep. Caretaker fatigue contributes to the condition. Sleep deprivation likely exacerbates fatigue. A home sleep study was considered but deferred due to the need for an in-lab study because of oxygen  use.  Discussed portable oxygen  for travel to alleviate stress related to upcoming travel plans. - Check thyroid  function to rule out thyroid  dysfunction as a cause of fatigue. - Advise her to discuss caretaker fatigue with her primary care provider. - Instruct her to contact her oxygen  provider for portable oxygen  arrangements for travel.  Allergic symptoms with peripheral eosinophilia Allergic symptoms persist with high eosinophil count noted in previous blood work. Symptoms include reactions to various smells and foods, with laundry detergent being a potential trigger. - Order blood work to recheck eosinophil levels.      Advised if symptoms do not improve or worsen, to please contact office for sooner follow up or seek emergency care.    I spent 35 minutes  of dedicated to the care of this patient on the date of this encounter to include pre-visit review of records, face-to-face time with the patient discussing conditions above, post visit ordering of testing, clinical documentation with the electronic health record, making appropriate referrals as documented, and communicating necessary findings to members of the patients care team.     C. Leita Sanders, MD Advanced Bronchoscopy PCCM [facility]    *This note was generated using voice recognition software/Dragon and/or AI transcription program.  Despite best efforts to proofread, errors can occur which can change the meaning. Any transcriptional errors that result from this process are unintentional and may not be fully corrected at the time of dictation.

## 2024-03-11 NOTE — Telephone Encounter (Signed)
 For [place] or [place] [facility] she would have to go to [place] is she willing to do that?

## 2024-03-18 DIAGNOSIS — G4736 Sleep related hypoventilation in conditions classified elsewhere: Secondary | ICD-10-CM | POA: Diagnosis not present

## 2024-03-21 ENCOUNTER — Encounter: Payer: Self-pay | Admitting: Pulmonary Disease

## 2024-03-30 ENCOUNTER — Other Ambulatory Visit: Payer: Self-pay | Admitting: Internal Medicine

## 2024-03-30 DIAGNOSIS — Z1231 Encounter for screening mammogram for malignant neoplasm of breast: Secondary | ICD-10-CM

## 2024-04-17 DIAGNOSIS — R053 Chronic cough: Secondary | ICD-10-CM | POA: Diagnosis not present

## 2024-04-17 DIAGNOSIS — E213 Hyperparathyroidism, unspecified: Secondary | ICD-10-CM | POA: Diagnosis not present

## 2024-04-17 DIAGNOSIS — R7989 Other specified abnormal findings of blood chemistry: Secondary | ICD-10-CM | POA: Diagnosis not present

## 2024-04-17 DIAGNOSIS — G4734 Idiopathic sleep related nonobstructive alveolar hypoventilation: Secondary | ICD-10-CM | POA: Diagnosis not present

## 2024-04-22 ENCOUNTER — Ambulatory Visit
Admission: RE | Admit: 2024-04-22 | Discharge: 2024-04-22 | Disposition: A | Discharge status: Home / Self Care | Source: Ambulatory Visit | Attending: Internal Medicine | Admitting: Internal Medicine

## 2024-04-22 DIAGNOSIS — Z1231 Encounter for screening mammogram for malignant neoplasm of breast: Secondary | ICD-10-CM | POA: Diagnosis not present

## 2024-04-23 DIAGNOSIS — E7849 Other hyperlipidemia: Secondary | ICD-10-CM | POA: Diagnosis not present

## 2024-04-23 DIAGNOSIS — I700 Atherosclerosis of aorta: Secondary | ICD-10-CM | POA: Diagnosis not present

## 2024-04-23 DIAGNOSIS — E559 Vitamin D deficiency, unspecified: Secondary | ICD-10-CM | POA: Diagnosis not present

## 2024-04-23 DIAGNOSIS — R7989 Other specified abnormal findings of blood chemistry: Secondary | ICD-10-CM | POA: Diagnosis not present

## 2024-04-23 DIAGNOSIS — E213 Hyperparathyroidism, unspecified: Secondary | ICD-10-CM | POA: Diagnosis not present

## 2024-04-23 DIAGNOSIS — J8410 Pulmonary fibrosis, unspecified: Secondary | ICD-10-CM | POA: Diagnosis not present

## 2024-04-23 DIAGNOSIS — R7303 Prediabetes: Secondary | ICD-10-CM | POA: Diagnosis not present

## 2024-04-23 DIAGNOSIS — E034 Atrophy of thyroid (acquired): Secondary | ICD-10-CM | POA: Diagnosis not present

## 2024-05-01 ENCOUNTER — Other Ambulatory Visit: Payer: Self-pay | Admitting: Internal Medicine

## 2024-05-01 DIAGNOSIS — E7849 Other hyperlipidemia: Secondary | ICD-10-CM

## 2024-05-01 DIAGNOSIS — I700 Atherosclerosis of aorta: Secondary | ICD-10-CM

## 2024-05-01 DIAGNOSIS — R0609 Other forms of dyspnea: Secondary | ICD-10-CM

## 2024-05-12 ENCOUNTER — Encounter (HOSPITAL_COMMUNITY): Payer: Self-pay

## 2024-05-14 ENCOUNTER — Ambulatory Visit
Admission: RE | Admit: 2024-05-14 | Discharge: 2024-05-14 | Disposition: A | Discharge status: Home / Self Care | Source: Ambulatory Visit | Attending: Internal Medicine | Admitting: Internal Medicine

## 2024-05-14 DIAGNOSIS — R0609 Other forms of dyspnea: Secondary | ICD-10-CM | POA: Insufficient documentation

## 2024-05-14 DIAGNOSIS — E7849 Other hyperlipidemia: Secondary | ICD-10-CM | POA: Diagnosis present

## 2024-05-14 DIAGNOSIS — I700 Atherosclerosis of aorta: Secondary | ICD-10-CM | POA: Insufficient documentation

## 2024-05-14 MED ORDER — NITROGLYCERIN 0.4 MG SL SUBL
0.8000 mg | SUBLINGUAL_TABLET | Freq: Once | SUBLINGUAL | Status: AC
  Administered 2024-05-14: 0.8 mg via SUBLINGUAL
  Filled 2024-05-14: qty 25

## 2024-05-14 MED ORDER — IOHEXOL 350 MG/ML SOLN
100.0000 mL | Freq: Once | INTRAVENOUS | Status: AC | PRN
  Administered 2024-05-14: 100 mL via INTRAVENOUS

## 2024-05-14 NOTE — Progress Notes (Signed)
 Patient tolerated procedure well. W/C to lobby.  Ambulate w/o difficulty. Denies light headedness or being dizzy. Encouraged to drink extra water today and reasoning explained. Verbalized understanding. All questions answered. ABC intact. No further needs. Discharge from procedure area w/o issues.

## 2024-05-18 ENCOUNTER — Other Ambulatory Visit: Payer: Self-pay

## 2024-05-18 MED ORDER — BREZTRI AEROSPHERE 160-9-4.8 MCG/ACT IN AERO: 2.0000 | INHALATION_SPRAY | Freq: Two times a day (BID) | RESPIRATORY_TRACT | 3 refills | Status: AC

## 2024-07-02 ENCOUNTER — Ambulatory Visit: Admitting: Pulmonary Disease

## 2024-07-02 ENCOUNTER — Encounter: Payer: Self-pay | Admitting: Pulmonary Disease

## 2024-07-02 VITALS — BP 108/70 | HR 83 | Temp 97.9°F | Ht 64.0 in | Wt 117.6 lb

## 2024-07-02 DIAGNOSIS — R0602 Shortness of breath: Secondary | ICD-10-CM

## 2024-07-02 DIAGNOSIS — R053 Chronic cough: Secondary | ICD-10-CM | POA: Diagnosis not present

## 2024-07-02 DIAGNOSIS — J84112 Idiopathic pulmonary fibrosis: Secondary | ICD-10-CM | POA: Diagnosis not present

## 2024-07-02 DIAGNOSIS — K219 Gastro-esophageal reflux disease without esophagitis: Secondary | ICD-10-CM | POA: Diagnosis not present

## 2024-07-02 DIAGNOSIS — G4734 Idiopathic sleep related nonobstructive alveolar hypoventilation: Secondary | ICD-10-CM

## 2024-07-02 NOTE — Patient Instructions (Addendum)
 VISIT SUMMARY:  During today's visit, we discussed your worsening cough and breathing difficulties related to idiopathic pulmonary fibrosis. We reviewed your current symptoms, including your need for supplemental oxygen  and the persistent cough you experience, especially after eating or drinking. We also talked about your pain in cold weather and your history of a small gallstone.  YOUR PLAN:  -IDIOPATHIC PULMONARY FIBROSIS: Idiopathic pulmonary fibrosis is a chronic lung disease that causes scarring of the lungs, leading to breathing difficulties. We will schedule a six-minute walk test to assess your exercise tolerance and oxygen  levels. Continue using supplemental oxygen  as needed, especially at night and during the day when your oxygen  levels drop.  A new medication called neradomilast (Jascayd) has been approved for IPF.  We will discuss after your 6-minute walk and pulmonary function test potentially starting on this medication.  -CHRONIC COUGH ASSOCIATED WITH IDIOPATHIC PULMONARY FIBROSIS: Your persistent cough is likely due to post-nasal drainage and is exacerbated after eating or drinking. Continue taking Claritin for allergies and using your inhaler, even though they have not significantly improved your symptoms.  -HYPOXEMIA SECONDARY TO IDIOPATHIC PULMONARY FIBROSIS: Hypoxemia means low oxygen  levels in the blood, which occurs with exertion in your case. Continue using supplemental oxygen  as needed to manage this condition. We will also schedule a six-minute walk test to evaluate your oxygen  levels during physical activity.  INSTRUCTIONS:  Please follow up for the scheduled six-minute walk test to assess your exercise tolerance and oxygen  levels.  Upon follow-up visit will discuss starting the new medication Jascayd for your pulmonary fibrosis.  Continue using supplemental oxygen  as needed and take your medications as prescribed. If you experience any new or worsening symptoms, contact our  office.

## 2024-07-02 NOTE — Progress Notes (Signed)
 Subjective:    Patient ID: Cheyenne Wong, female    DOB: [birth year], 73 y.o.   MRN: [id number]  Patient Care Team: Fernande Ophelia JINNY DOUGLAS, MD as PCP - General (Internal Medicine) Tamea Dedra CROME, MD as Consulting Physician (Pulmonary Disease)  Chief Complaint  Patient presents with   Medical Management of Chronic Issues    Cough. Shortness of breath on exertion and at rest.     BACKGROUND/INTERVAL:Patient is a 73 year old with UIP/idiopathic pulmonary fibrosis and nocturnal hypoxemia who presents for follow-up. This is a scheduled visit. Last visit here was on 05 March 2024. Most recent PFTs, chest CT and echocardiogram showed no progression of disease and echocardiogram did not show evidence of pulmonary hypertension. She is on nocturnal oxygen  for nocturnal hypoxemia and notes benefit from the therapy.  Notes fairly well control of her cough with Breztri .  She does have mild airways reactivity as well.  She has failed trials of Esbriet and Ofev .   HPI Discussed the use of AI scribe software for clinical note transcription with the patient, who gave verbal consent to proceed.  History of Present Illness   Cheyenne Wong is a 73 year old female with idiopathic pulmonary fibrosis who presents with worsening cough and breathing difficulties.  She experiences breathing difficulties, particularly on inclines, while her breathing is manageable on flat surfaces. Her oxygen  saturation is 100% at rest but drops with any walking. She uses supplemental oxygen  at night and occasionally during the day when her oxygen  levels drop to about 93% or when she feels lightheaded.  She has a constant cough, especially after eating or drinking, which takes about 30 minutes to clear. She attributes this to drainage. She is taking Claritin for allergies and uses an inhaler, but notes no significant change in her symptoms except for increased pain in cold weather.  She experiences pain described as a  'toothache' that resolves when she warms up indoors. This pain has been present for a couple of months.  Pain is located in the epigastric area.  It is fleeting in nature.  She has a history of a small gallstone, which has not been recently evaluated. She takes Nexium for reflux but not daily due to concerns about long-term use, and she is not currently experiencing reflux symptoms. She continues to sleep with her head elevated.     THERAPIES tried and failed: Esbriet (pirfenidone) - d/c'd due to side effects + cost Ofev  (nintendanib) - nausea,diarrhea, legs felt tight Has declined Pulmonary Rehab  Declined research protocols   DATA 10/24/2021 overnight oximetry: Qualified for O2 with significant desaturations overnight as low as 84%. 10/26/2021 CT chest high-resolution: Slightly worsened fibrotic changes and honeycombing from prior CT of 11 January 2020, findings consistent with UIP. 12/13/2021 PFTs: FEV1 1.50 L or 67% predicted, FVC 1.87 L or 63% predicted, FEV1/FVC 76%. Lung volumes moderately reduced, there is a small airways component.  Diffusion capacity moderately reduced.  Compared to PFTs performed at [facility] in April 2022 there has been slight decline in function. 07/20/2022 chest x-ray: Redemonstration of fibrosis/interstitial lung disease with no definitive acute superimposed disease. 10/26/2021 high-resolution CT chest: moderate pulmonary fibrosis in a pattern apical to basal gradient.  Fibrotic findings slightly worsened in comparison to prior examination of 11 January 2020.  Findings consistent with UIP. 02/19/2023 CT chest, high-resolution: Stable fibrotic changes in the lungs, categorized as diagnostic of usual interstitial pneumonia (UIP). 02/19/2023 PFTs: FEV1 1.35 L or 61% of predicted, FVC 1.78  L or 61% predicted, FEV1/FVC 76%, lung volumes moderately reduced.  Mild diffusion defect.  Overall no significant change from prior. 04/16/2023 echocardiogram: LVEF 55 to 60%,  diastolic parameters are normal.  No wall motion abnormalities.  Mild calcification of mitral valve leaflets moderate mitral annular ossification.  Trivial mitral valve regurgitation.  No mitral valve stenosis. 03/05/2024 PFTs: FEV1 is 1.26 L or 55% predicted, FVC is 1.86 L or 61% predicted, FEV1/FVC 68% lung volumes moderately reduced.  Diffusion capacity moderately reduced and corrects to normal by alveolar volume.  Overall lung function is relatively stable.    Review of Systems A 10 point review of systems was performed and it is as noted above otherwise negative.   Patient Active Problem List   Diagnosis Date Noted   GERD (gastroesophageal reflux disease) 11/16/2021   Prediabetes 07/29/2019   Other dysphagia 04/01/2017   Pulmonary fibrosis (HCC) 10/15/2016   Need for hepatitis C screening test 06/02/2015   Screening for breast cancer 04/29/2015   Hypothyroidism 08/26/2012   Vitamin D  deficiency 10/02/2011   Hyperlipidemia 09/04/2011    Social History   Tobacco Use   Smoking status: Never   Smokeless tobacco: Never  Substance Use Topics   Alcohol use: No    Allergies[1]  Active Medications[2]  Immunization History  Administered Date(s) Administered   Influenza Inj Mdck Quad Pf 05/08/2018, 05/25/2019, 06/16/2020, 06/02/2021   Influenza Split 05/04/2011, 05/19/2012   Influenza,inj,Quad PF,6+ Mos 03/31/2013, 06/02/2015   Influenza,inj,quad, With Preservative 04/15/2016   Influenza-Unspecified 06/03/2017   Janssen (J&J) SARS-COV-2 Vaccination 10/23/2019   Moderna SARS-COV2 Booster Vaccination 06/24/2022   PFIZER Comirnaty(Gray Top)Covid-19 Tri-Sucrose Vaccine 05/23/2020   Pneumococcal Conjugate-13 06/20/2018   Pneumococcal Polysaccharide-23 07/16/2013, 07/29/2019   Tdap 07/16/2008, 07/28/2018   Zoster Recombinant(Shingrix) 12/13/2022, 04/08/2023        Objective:     Vitals:   07/02/24 0903  BP: 108/70  Pulse: 83  Temp: 97.9 F (36.6 C)  Height: 5' 4  (1.626 m)  Weight: 117 lb 9.6 oz (53.3 kg)  SpO2: 100%  TempSrc: Temporal  BMI (Calculated): 20.18     SpO2: 100 %  GENERAL: Well-developed, well-nourished woman, no acute distress.  Fully ambulatory, mild conversational dyspnea.  Mild tachypnea noted.  Occasionally will have dry cough. HEAD: Normocephalic, atraumatic.  EYES: Pupils equal, round, reactive to light.  No scleral icterus.  MOUTH: Oral mucosa moist.  No thrush, pharynx clear.  No angular cheilitis(previously noted). NECK: Supple. No thyromegaly. Trachea midline. No JVD.  No adenopathy. PULMONARY: Good air entry bilaterally.  No wheezes, faint bibasilar crackles. CARDIOVASCULAR: S1 and S2. Regular rate and rhythm.  No rubs, murmurs or gallops heard. ABDOMEN: Benign. MUSCULOSKELETAL: No joint deformity, no clubbing, no edema.  NEUROLOGIC: No overt focal deficit, no gait disturbance, speech is fluent. SKIN: Intact,warm,dry. PSYCH: Mood and behavior normal.   Assessment & Plan:     ICD-10-CM   1. IPF (idiopathic pulmonary fibrosis) (HCC)  J84.112 6 minute walk    Pulmonary function test    2. Chronic cough  R05.3     3. Nocturnal hypoxemia  G47.34     4. Gastroesophageal reflux disease, unspecified whether esophagitis present  K21.9     5. Shortness of breath  R06.02 6 minute walk    Pulmonary function test      Orders Placed This Encounter  Procedures   6 minute walk    Standing Status:   Future    Expected Date:   07/27/2024    Expiration  Date:   07/02/2025    Where should this test be performed?:   [facility]   Pulmonary function test    Standing Status:   Future    Expected Date:   07/27/2024    Expiration Date:   07/02/2025    Where should this test be performed?:   Outpatient Pulmonary    What type of PFT is being ordered?:   Simple Spirometry w/DLCO, no bronchodilator   Discussion:    Idiopathic pulmonary fibrosis Chronic condition with exertional dyspnea and hypoxemia. Oxygen  saturation drops with  exertion reportedly to 93%, requiring supplemental oxygen  at night and intermittently during the day. No significant change in symptoms despite current management. - Scheduled six-minute walk test to assess exercise tolerance and oxygen  saturation - Continue nocturnal supplemental oxygen  therapy as prescribed - Repeat spirometry and DLCO, can on day of 6-minute walk - New medication FDA approved for IPF is Jascayd, will discuss initiating after 6-minute walk and PFTs none  Chronic cough associated with idiopathic pulmonary fibrosis Persistent cough likely related to post-nasal drainage, exacerbated after eating or drinking. Current management with Claritin and inhaler has not significantly improved symptoms. - Continue current management with Claritin and inhaler  Hypoxemia secondary to idiopathic pulmonary fibrosis Hypoxemia occurs with exertion, with oxygen  saturation dropping below 93%. Managed with supplemental oxygen , which alleviates symptoms. - Continue supplemental oxygen  therapy as needed - Scheduled six-minute walk test to evaluate hypoxemia during exertion      Advised if symptoms do not improve or worsen, to please contact office for sooner follow up or seek emergency care.    I spent 40 minutes of dedicated to the care of this patient on the date of this encounter to include pre-visit review of records, face-to-face time with the patient discussing conditions above, post visit ordering of testing, clinical documentation with the electronic health record, making appropriate referrals as documented, and communicating necessary findings to members of the patients care team.     C. Leita Sanders, MD Advanced Bronchoscopy [facility]    *This note was generated using voice recognition software/Dragon and/or AI transcription program.  Despite best efforts to proofread, errors can occur which can change the meaning. Any transcriptional errors that result from  this process are unintentional and may not be fully corrected at the time of dictation.    [1]  Allergies Allergen Reactions   Peanut Butter Flavoring Agent (Non-Screening) Shortness Of Breath    Peanut Butter   Tramadol Shortness Of Breath   Walnut Shortness Of Breath   Penicillins Rash    Allergy as a child  [2]  Current Meds  Medication Sig   albuterol  (VENTOLIN  HFA) 108 (90 Base) MCG/ACT inhaler Inhale 1 puff into the lungs every 6 (six) hours as needed.   atorvastatin (LIPITOR) 10 MG tablet Take 10 mg by mouth daily.   benzonatate  (TESSALON  PERLES) 100 MG capsule Take 1 capsule (100 mg total) by mouth 3 (three) times daily as needed for cough.   budesonide -glycopyrrolate-formoterol (BREZTRI  AEROSPHERE) 160-9-4.8 MCG/ACT AERO inhaler Inhale 2 puffs into the lungs in the morning and at bedtime.   cholecalciferol (VITAMIN D ) 25 MCG (1000 UNIT) tablet Take 2,000 Units by mouth daily.   cyanocobalamin (VITAMIN B12) 1000 MCG tablet Take 5,000 mcg by mouth once a week.   esomeprazole (NEXIUM) 40 MG capsule Take 40 mg by mouth daily at 12 noon.   famotidine (PEPCID) 20 MG tablet Take 20 mg by mouth 2 (two) times daily.   ibuprofen (  ADVIL) 200 MG tablet Take 200 mg by mouth every 6 (six) hours as needed.   OXYGEN  Inhale 2 L into the lungs at bedtime. And as needed

## 2024-07-06 ENCOUNTER — Ambulatory Visit

## 2024-08-17 ENCOUNTER — Ambulatory Visit

## 2024-08-24 ENCOUNTER — Ambulatory Visit: Admitting: Pulmonary Disease

## 2024-08-24 ENCOUNTER — Encounter
